# Patient Record
Sex: Female | Born: 2000 | Race: White | Hispanic: No | Marital: Single | Smoking: Never smoker
Health system: Southern US, Community
[De-identification: ages and names within clinical notes are randomized; demographics above are authoritative.]

## PROBLEM LIST (undated history)

## (undated) DIAGNOSIS — F32A Depression, unspecified: Secondary | ICD-10-CM

## (undated) DIAGNOSIS — F419 Anxiety disorder, unspecified: Secondary | ICD-10-CM

## (undated) HISTORY — DX: Anxiety disorder, unspecified: F41.9

## (undated) HISTORY — DX: Depression, unspecified: F32.A

---
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
## 2020-01-09 ENCOUNTER — Other Ambulatory Visit: Payer: Self-pay

## 2020-01-09 ENCOUNTER — Encounter: Payer: Self-pay | Admitting: Family Medicine

## 2020-01-09 ENCOUNTER — Ambulatory Visit: Payer: BC Managed Care – PPO | Admitting: Family Medicine

## 2020-01-09 VITALS — BP 123/80 | HR 91 | Temp 98.2°F | Ht 65.5 in | Wt 146.0 lb

## 2020-01-09 DIAGNOSIS — F339 Major depressive disorder, recurrent, unspecified: Secondary | ICD-10-CM

## 2020-01-09 DIAGNOSIS — Z3009 Encounter for other general counseling and advice on contraception: Secondary | ICD-10-CM | POA: Diagnosis not present

## 2020-01-09 LAB — PREGNANCY, URINE: Preg Test, Ur: NEGATIVE

## 2020-01-09 MED ORDER — SERTRALINE HCL 50 MG PO TABS: 50.0000 mg | ORAL_TABLET | Freq: Every day | ORAL | 1 refills | Status: DC

## 2020-01-09 NOTE — Progress Notes (Signed)
BP 123/80   Pulse 91   Temp 98.2 F (36.8 C)   Ht 5' 5.5" (1.664 m)   Wt 146 lb (66.2 kg)   LMP 01/04/2020   SpO2 99%   BMI 23.93 kg/m    Subjective:   Patient ID: Joy Green, female    DOB: April 18, 2001, 19 y.o.   MRN: [id number]  HPI: Joy Green is a 19 y.o. female presenting on 01/09/2020 for Medical Management of Chronic Issues, Contraception Management, and Depression   HPI Depression Patient is coming in today to discuss depression and anxiety. She says she has been anxious and depressed since she was 13 or 14 but it has not built up until this past year it has really built up to the point where she is just not feeling very well. She has started seeing a counselor just recently but he recommended that she come see [place] and discuss antidepressants. She has some passing suicidal ideations frequently but no action plan but she does admit that she used to cut when she was younger and she did cut herself once on her leg a week ago, no intentions to kill herself with that but just because of the feeling that it gives. She has not done it since. She has seen her counselor once over the phone and has another scheduled appointment. Patient denies any action plan for suicide currently and promises she will either call the hotline or [place] if she does get those thoughts again Depression screen [facility] 2/9 01/09/2020 11/29/2018 08/02/2017 12/11/2016 03/29/2016  Decreased Interest 3 0 - 0 0  Down, Depressed, Hopeless 3 0 0 0 0  PHQ - 2 Score 6 0 0 0 0  Altered sleeping 2 - 0 - -  Tired, decreased energy 1 - - - -  Change in appetite 1 - - - -  Feeling bad or failure about yourself  3 - - - -  Trouble concentrating 3 - - - -  Moving slowly or fidgety/restless 0 - - - -  Suicidal thoughts 1 - - - -  PHQ-9 Score 17 - 0 - -  Difficult doing work/chores - - - - -    Birth control counseling, continuation Patient is coming in for birth control counseling, she has been on Ortho-Cyclen but she has not been  the best about taking them consistently. She does have a new boyfriend that she is been with for 3 months and is sexually active but she is using condoms every time. She has done a lot of research and we discussed a lot of the forms of birth control and she would like to try Nexplanon.  Relevant past medical, surgical, family and social history reviewed and updated as indicated. Interim medical history since our last visit reviewed. Allergies and medications reviewed and updated.  Review of Systems  Constitutional: Negative for chills and fever.  Eyes: Negative for visual disturbance.  Respiratory: Negative for chest tightness and shortness of breath.   Cardiovascular: Negative for chest pain and leg swelling.  Gastrointestinal: Negative for abdominal pain.  Genitourinary: Negative for vaginal bleeding, vaginal discharge and vaginal pain.  Musculoskeletal: Negative for back pain and gait problem.  Skin: Negative for rash.  Neurological: Negative for light-headedness and headaches.  Psychiatric/Behavioral: Positive for dysphoric mood and sleep disturbance. Negative for agitation, behavioral problems, self-injury and suicidal ideas. The patient is nervous/anxious.   All other systems reviewed and are negative.   Per HPI unless specifically indicated above  Allergies as of 01/09/2020   No Known Allergies     Medication List       Accurate as of January 09, 2020 11:59 PM. If you have any questions, ask your nurse or doctor.        ibuprofen 200 MG tablet Commonly known as: ADVIL Take 400 mg by mouth as needed.   norgestimate-ethinyl estradiol 0.25-35 MG-MCG tablet Commonly known as: Sprintec 28 Take 1 tablet by mouth daily.   sertraline 50 MG tablet Commonly known as: Zoloft Take 1 tablet (50 mg total) by mouth daily. Started by: Elige Radon Kenetha Cozza, MD        Objective:   BP 123/80   Pulse 91   Temp 98.2 F (36.8 C)   Ht 5' 5.5" (1.664 m)   Wt 146 lb (66.2 kg)   LMP  01/04/2020   SpO2 99%   BMI 23.93 kg/m   Wt Readings from Last 3 Encounters:  01/09/20 146 lb (66.2 kg) (78 %, Z= 0.76)*  11/29/18 170 lb (77.1 kg) (93 %, Z= 1.48)*  08/02/17 147 lb 4 oz (66.8 kg) (84 %, Z= 1.01)*   * Growth percentiles are based on CDC (Girls, 2-20 Years) data.    Physical Exam Vitals and nursing note reviewed.  Constitutional:      General: She is not in acute distress.    Appearance: She is well-developed. She is not diaphoretic.  Eyes:     Conjunctiva/sclera: Conjunctivae normal.  Cardiovascular:     Rate and Rhythm: Normal rate and regular rhythm.     Heart sounds: Normal heart sounds. No murmur heard.   Pulmonary:     Effort: Pulmonary effort is normal. No respiratory distress.     Breath sounds: Normal breath sounds. No wheezing.  Musculoskeletal:        General: No tenderness. Normal range of motion.  Skin:    General: Skin is warm and dry.     Findings: No rash.  Neurological:     Mental Status: She is alert and oriented to person, place, and time.     Coordination: Coordination normal.  Psychiatric:        Mood and Affect: Mood is anxious and depressed.        Behavior: Behavior normal.        Thought Content: Thought content includes suicidal ideation. Thought content does not include suicidal plan.       Assessment & Plan:   Problem List Items Addressed This Visit    None    Visit Diagnoses    Birth control counseling    -  Primary   Relevant Orders   CBC with Differential/Platelet (Completed)   TSH (Completed)   Pregnancy, urine (Completed)   Depression, recurrent (HCC)       Relevant Medications   sertraline (ZOLOFT) 50 MG tablet      Will start patient on Zoloft, she does want to come back in for Nexplanon and she will do that at the next scheduled. She will continue her oral birth control until she gets the Nexplanon, she has enough she says. Follow up plan: Return in about 3 weeks (around 01/30/2020), or if symptoms worsen  or fail to improve, for Depression recheck and Nexplanon insertion.  Counseling provided for all of the vaccine components Orders Placed This Encounter  Procedures  . CBC with Differential/Platelet  . TSH  . Pregnancy, urine    Arville Care, MD [facility] 01/18/2020, 10:20 PM

## 2020-01-10 LAB — CBC WITH DIFFERENTIAL/PLATELET
Basophils Absolute: 0 10*3/uL (ref 0.0–0.2)
Basos: 0 %
EOS (ABSOLUTE): 0.2 10*3/uL (ref 0.0–0.4)
Eos: 2 %
Hematocrit: 40.6 % (ref 34.0–46.6)
Hemoglobin: 13.4 g/dL (ref 11.1–15.9)
Immature Grans (Abs): 0 10*3/uL (ref 0.0–0.1)
Immature Granulocytes: 0 %
Lymphocytes Absolute: 2.7 10*3/uL (ref 0.7–3.1)
Lymphs: 36 %
MCH: 29.7 pg (ref 26.6–33.0)
MCHC: 33 g/dL (ref 31.5–35.7)
MCV: 90 fL (ref 79–97)
Monocytes Absolute: 0.5 10*3/uL (ref 0.1–0.9)
Monocytes: 7 %
Neutrophils Absolute: 4.2 10*3/uL (ref 1.4–7.0)
Neutrophils: 55 %
Platelets: 297 10*3/uL (ref 150–450)
RBC: 4.51 x10E6/uL (ref 3.77–5.28)
RDW: 12.4 % (ref 11.7–15.4)
WBC: 7.7 10*3/uL (ref 3.4–10.8)

## 2020-01-10 LAB — TSH: TSH: 4.78 u[IU]/mL — ABNORMAL HIGH (ref 0.450–4.500)

## 2020-01-14 ENCOUNTER — Telehealth: Payer: Self-pay | Admitting: Family Medicine

## 2020-01-14 NOTE — Telephone Encounter (Signed)
Pt would like to be called with her lab results once reviewed by Dr Dettinger.

## 2020-01-28 ENCOUNTER — Encounter: Payer: Self-pay | Admitting: *Deleted

## 2020-01-28 ENCOUNTER — Encounter: Payer: Self-pay | Admitting: Family Medicine

## 2020-01-28 ENCOUNTER — Ambulatory Visit: Payer: BC Managed Care – PPO | Admitting: Family Medicine

## 2020-01-28 ENCOUNTER — Other Ambulatory Visit: Payer: Self-pay

## 2020-01-28 VITALS — BP 114/75 | HR 76 | Temp 98.2°F | Ht 65.5 in | Wt 144.0 lb

## 2020-01-28 DIAGNOSIS — Z30017 Encounter for initial prescription of implantable subdermal contraceptive: Secondary | ICD-10-CM

## 2020-01-28 DIAGNOSIS — R7989 Other specified abnormal findings of blood chemistry: Secondary | ICD-10-CM

## 2020-01-28 LAB — PREGNANCY, URINE: Preg Test, Ur: NEGATIVE

## 2020-01-28 MED ORDER — ETONOGESTREL 68 MG ~~LOC~~ IMPL: 68.0000 mg | DRUG_IMPLANT | Freq: Once | SUBCUTANEOUS | Status: DC

## 2020-01-28 NOTE — Progress Notes (Signed)
Nexplanon insertion: Patient educated on Nexplanon birth control and its side effects and the side effects from the procedure. Patient wishes to continue. Left inner arm prepped with Betadine. 3 mL of 2% lidocaine without epinephrine used for anesthesia. Device was prepped using sterile procedure. Nexplanon was inserted using package and Company directions. Steri-Strips were applied. Bleeding was minimal and patient tolerated procedure well.   Problem List Items Addressed This Visit    None    Visit Diagnoses    Encounter for initial prescription of implantable subdermal contraceptive    -  Primary   Relevant Medications   etonogestrel (NEXPLANON) implant 68 mg   Other Relevant Orders   Pregnancy, urine      Arville Care, MD [facility] 01/28/2020, 4:24 PM

## 2020-01-31 ENCOUNTER — Other Ambulatory Visit: Payer: Self-pay | Admitting: Family Medicine

## 2020-01-31 DIAGNOSIS — F339 Major depressive disorder, recurrent, unspecified: Secondary | ICD-10-CM

## 2020-02-24 ENCOUNTER — Other Ambulatory Visit: Payer: Self-pay

## 2020-02-24 ENCOUNTER — Ambulatory Visit: Payer: BC Managed Care – PPO | Admitting: Family Medicine

## 2020-02-24 ENCOUNTER — Encounter: Payer: Self-pay | Admitting: Family Medicine

## 2020-02-24 VITALS — BP 107/66 | HR 72 | Temp 97.9°F | Ht 65.5 in | Wt 146.0 lb

## 2020-02-24 DIAGNOSIS — R7989 Other specified abnormal findings of blood chemistry: Secondary | ICD-10-CM | POA: Diagnosis not present

## 2020-02-24 DIAGNOSIS — F339 Major depressive disorder, recurrent, unspecified: Secondary | ICD-10-CM | POA: Diagnosis not present

## 2020-02-24 DIAGNOSIS — F419 Anxiety disorder, unspecified: Secondary | ICD-10-CM

## 2020-02-24 MED ORDER — SERTRALINE HCL 50 MG PO TABS: 50.0000 mg | ORAL_TABLET | Freq: Every day | ORAL | 1 refills | Status: DC

## 2020-02-24 NOTE — Progress Notes (Signed)
BP 107/66   Pulse 72   Temp 97.9 F (36.6 C)   Ht 5' 5.5" (1.664 m)   Wt 146 lb (66.2 kg)   SpO2 98%   BMI 23.93 kg/m    Subjective:   Patient ID: Joy Green, female    DOB: 11/25/2000, 19 y.o.   MRN: [id number]  HPI: Joy Green is a 19 y.o. female presenting on 02/24/2020 for Medical Management of Chronic Issues and Depression   HPI Depression recheck Patient has been taking sertraline but she says she is been forgetting at times.  She still describes some mood lability where she goes to extreme irritability and anger sometimes down.  She has not been taking her medicine consistently and is getting more consistent, she had a job change 2 weeks ago which entailed a schedule change which is affected her ability to do more consistently.  Patient denies any suicidal ideations or thoughts of hurting self. Depression screen [facility] 2/9 02/24/2020 01/28/2020 01/09/2020 11/29/2018 08/02/2017  Decreased Interest 2 3 3  0 -  Down, Depressed, Hopeless 2 3 3  0 0  PHQ - 2 Score 4 6 6  0 0  Altered sleeping 1 2 2  - 0  Tired, decreased energy 1 1 1  - -  Change in appetite 1 1 1  - -  Feeling bad or failure about yourself  3 3 3  - -  Trouble concentrating 3 3 3  - -  Moving slowly or fidgety/restless 0 0 0 - -  Suicidal thoughts 1 1 1  - -  PHQ-9 Score 14 17 17  - 0  Difficult doing work/chores - - - - -    Patient had an elevated TSH on previous labs, will recheck that today.  Patient had a Nexplanon we will follow up on that, appears to be doing well.  Relevant past medical, surgical, family and social history reviewed and updated as indicated. Interim medical history since our last visit reviewed. Allergies and medications reviewed and updated.  Review of Systems  Constitutional: Negative for chills and fever.  Eyes: Negative for visual disturbance.  Respiratory: Negative for chest tightness and shortness of breath.   Cardiovascular: Negative for chest pain and leg swelling.    Musculoskeletal: Negative for back pain and gait problem.  Skin: Negative for rash.  Neurological: Negative for light-headedness and headaches.  Psychiatric/Behavioral: Positive for dysphoric mood and sleep disturbance. Negative for agitation, behavioral problems, self-injury and suicidal ideas. The patient is nervous/anxious.   All other systems reviewed and are negative.   Per HPI unless specifically indicated above   Allergies as of 02/24/2020   No Known Allergies     Medication List       Accurate as of February 24, 2020  4:37 PM. If you have any questions, ask your nurse or doctor.        STOP taking these medications   norgestimate-ethinyl estradiol 0.25-35 MG-MCG tablet Commonly known as: Sprintec 28 Stopped by: Delman Goshorn, MD     TAKE these medications   ibuprofen 200 MG tablet Commonly known as: ADVIL Take 400 mg by mouth as needed.   sertraline 50 MG tablet Commonly known as: ZOLOFT Take 1 tablet (50 mg total) by mouth at bedtime. What changed: when to take this Changed by: Eva Vallee, MD        Objective:   BP 107/66   Pulse 72   Temp 97.9 F (36.6 C)   Ht 5' 5.5" (1.664 m)    146 lb (66.2 kg)   SpO2 98%   BMI 23.93 kg/m   Wt Readings from Last 3 Encounters:  02/24/20 146 lb (66.2 kg) (77 %, Z= 0.75)*  01/28/20 144 lb (65.3 kg) (75 %, Z= 0.69)*  01/09/20 146 lb (66.2 kg) (78 %, Z= 0.76)*   * Growth percentiles are based on CDC (Girls, 2-20 Years) data.    Physical Exam Vitals and nursing note reviewed.  Constitutional:      General: She is not in acute distress.    Appearance: She is well-developed. She is not diaphoretic.  Eyes:     Conjunctiva/sclera: Conjunctivae normal.  Cardiovascular:     Rate and Rhythm: Normal rate and regular rhythm.     Heart sounds: Normal heart sounds. No murmur heard.   Pulmonary:     Effort: Pulmonary effort is normal. No respiratory distress.     Breath sounds: Normal breath sounds. No  wheezing.  Musculoskeletal:        General: No tenderness. Normal range of motion.  Skin:    General: Skin is warm and dry.     Findings: No rash.  Neurological:     Mental Status: She is alert and oriented to person, place, and time.     Coordination: Coordination normal.  Psychiatric:        Mood and Affect: Mood is anxious and depressed.        Behavior: Behavior normal.        Thought Content: Thought content does not include suicidal ideation. Thought content does not include suicidal plan.       Assessment & Plan:   Problem List Items Addressed This Visit      Other   Elevated TSH   Relevant Orders   Thyroid Panel With TSH    Other Visit Diagnoses    Depression, recurrent (HCC)    -  Primary   Relevant Medications   sertraline (ZOLOFT) 50 MG tablet   Anxiety       Relevant Medications   sertraline (ZOLOFT) 50 MG tablet      Will continue the Zoloft at the current dose and follow-up in a couple months.  We will recheck thyroid levels because she was borderline on the last time. Follow up plan: Return in about 2 months (around 04/25/2020), or if symptoms worsen or fail to improve, for Depression and anxiety recheck.  Counseling provided for all of the vaccine components Orders Placed This Encounter  Procedures  . Thyroid Panel With TSH    Arville Care, MD [facility] 02/24/2020, 4:37 PM

## 2020-02-25 LAB — THYROID PANEL WITH TSH
Free Thyroxine Index: 1.5 (ref 1.2–4.9)
T3 Uptake Ratio: 24 % (ref 24–39)
T4, Total: 6.2 ug/dL (ref 4.5–12.0)
TSH: 1.5 u[IU]/mL (ref 0.450–4.500)

## 2020-02-26 ENCOUNTER — Other Ambulatory Visit: Payer: Self-pay

## 2020-02-26 ENCOUNTER — Encounter: Payer: Self-pay | Admitting: Family

## 2020-02-26 ENCOUNTER — Ambulatory Visit (INDEPENDENT_AMBULATORY_CARE_PROVIDER_SITE_OTHER): Payer: BC Managed Care – PPO | Admitting: Family

## 2020-02-26 ENCOUNTER — Other Ambulatory Visit (HOSPITAL_COMMUNITY)
Admission: RE | Admit: 2020-02-26 | Discharge: 2020-02-26 | Disposition: A | Discharge status: Home / Self Care | Payer: BC Managed Care – PPO | Source: Ambulatory Visit | Attending: Family | Admitting: Family

## 2020-02-26 VITALS — BP 127/76 | HR 98 | Temp 97.8°F | Ht 65.5 in | Wt 149.3 lb

## 2020-02-26 DIAGNOSIS — Z0000 Encounter for general adult medical examination without abnormal findings: Secondary | ICD-10-CM | POA: Diagnosis not present

## 2020-02-26 DIAGNOSIS — Z01419 Encounter for gynecological examination (general) (routine) without abnormal findings: Secondary | ICD-10-CM | POA: Diagnosis not present

## 2020-02-26 DIAGNOSIS — Z1151 Encounter for screening for human papillomavirus (HPV): Secondary | ICD-10-CM | POA: Diagnosis not present

## 2020-02-26 NOTE — Progress Notes (Signed)
Subjective:    Patient ID: Joy Green, female    DOB: [birth year], 19 y.o.   MRN: [id number]  Chief Complaint  Patient presents with  . CPE    with Pap   PT presents to the office today for CPE with pap. Denies vaginal discharge or pain. She is sexually active since she was 19 years old. She reports she has had approx 7-8 partners.   She has Nexplanon place in July 2021.  Gynecologic Exam The patient's pertinent negatives include no genital itching, genital lesions, genital odor, vaginal bleeding or vaginal discharge. Pertinent negatives include no constipation, diarrhea, painful intercourse, sore throat or urgency. The treatment provided no relief.      Review of Systems  HENT: Negative for sore throat.   Gastrointestinal: Negative for constipation and diarrhea.  Genitourinary: Negative for urgency and vaginal discharge.  All other systems reviewed and are negative.  Family History  Problem Relation Age of Onset  . Hypertension Father   . Cancer Maternal Grandmother        breast  . Diabetes Paternal Grandmother   . COPD Paternal Grandmother   . Cancer Paternal Grandfather lung   Social History   Socioeconomic History  . Marital status: Single    Spouse name: Not on file  . Number of children: Not on file  . Years of education: Not on file  . Highest education level: Not on file  Occupational History  . Not on file  Tobacco Use  . Smoking status: Never Smoker  . Smokeless tobacco: Never Used  Substance and Sexual Activity  . Alcohol use: Not on file  . Drug use: Not on file  . Sexual activity: Not on file  Other Topics Concern  . Not on file  Social History Narrative  . Not on file   Social Determinants of Health   Financial Resource Strain:   . Difficulty of Paying Living Expenses: Not on file  Food Insecurity:   . Worried About Programme researcher, broadcasting/film/video in the Last Year: Not on file  . Ran Out of Food in the Last Year: Not on file  Transportation Needs:    . Lack of Transportation (Medical): Not on file  . Lack of Transportation (Non-Medical): Not on file  Physical Activity:   . Days of Exercise per Week: Not on file  . Minutes of Exercise per Session: Not on file  Stress:   . Feeling of Stress : Not on file  Social Connections:   . Frequency of Communication with Friends and Family: Not on file  . Frequency of Social Gatherings with Friends and Family: Not on file  . Attends Religious Services: Not on file  . Active Member of Clubs or Organizations: Not on file  . Attends Banker Meetings: Not on file  . Marital Status: Not on file  Intimate Partner Violence:   . Fear of Current or Ex-Partner: Not on file  . Emotionally Abused: Not on file  . Physically Abused: Not on file  . Sexually Abused: Not on file       Objective:   Physical Exam Vitals reviewed.  Constitutional:      General: She is not in acute distress.    Appearance: She is well-developed.  HENT:     Head: Normocephalic and atraumatic.     Right Ear: Tympanic membrane normal.     Left Ear: Tympanic membrane normal.  Eyes:     Pupils: Pupils are equal, round,  and reactive to light.  Neck:     Thyroid: No thyromegaly.  Cardiovascular:     Rate and Rhythm: Normal rate and regular rhythm.     Heart sounds: Normal heart sounds. No murmur heard.   Pulmonary:     Effort: Pulmonary effort is normal. No respiratory distress.     Breath sounds: Normal breath sounds. No wheezing.  Chest:     Breasts:        Right: No swelling, bleeding, inverted nipple, mass, nipple discharge, skin change or tenderness.        Left: No swelling, bleeding, inverted nipple, mass, nipple discharge, skin change or tenderness.  Abdominal:     General: Bowel sounds are normal. There is no distension.     Palpations: Abdomen is soft.     Tenderness: There is no abdominal tenderness.  Genitourinary:    General: Normal vulva.     Comments: Bimanual exam- no adnexal masses or  tenderness, ovaries nonpalpable   Cervix parous and pink- No discharge  Musculoskeletal:        General: No tenderness. Normal range of motion.     Cervical back: Normal range of motion and neck supple.  Skin:    General: Skin is warm and dry.  Neurological:     Mental Status: She is alert and oriented to person, place, and time.     Cranial Nerves: No cranial nerve deficit.     Deep Tendon Reflexes: Reflexes are normal and symmetric.  Psychiatric:        Behavior: Behavior normal.        Thought Content: Thought content normal.        Judgment: Judgment normal.       BP 127/76   Pulse 98   Temp 97.8 F (36.6 C)   Ht 5' 5.5" (1.664 m)   Wt 149 lb 5 oz (67.7 kg)   BMI 24.47 kg/m      Assessment & Plan:  GLYNNIS GAVEL comes in today with chief complaint of CPE (with Pap)   Diagnosis and orders addressed:  1. Annual physical exam  2. Gynecologic exam normal - Cytology - PAP([place])  Labs drawn last month Health Maintenance reviewed Diet and exercise encouraged  Follow up plan: As needed and keep follow up with PCP  Jannifer Rodney, FNP

## 2020-02-26 NOTE — Patient Instructions (Signed)

## 2020-03-10 LAB — CYTOLOGY - PAP
Chlamydia: NEGATIVE
Comment: NEGATIVE
Comment: NEGATIVE
Comment: NEGATIVE
Comment: NEGATIVE
Comment: NORMAL
HSV1: NEGATIVE
HSV2: NEGATIVE
High risk HPV: POSITIVE — AB
Neisseria Gonorrhea: NEGATIVE
Trichomonas: NEGATIVE

## 2020-03-31 ENCOUNTER — Encounter: Payer: Self-pay | Admitting: *Deleted

## 2020-05-05 ENCOUNTER — Ambulatory Visit: Payer: BC Managed Care – PPO | Admitting: Family Medicine

## 2020-05-05 ENCOUNTER — Other Ambulatory Visit: Payer: Self-pay

## 2020-05-05 ENCOUNTER — Encounter: Payer: Self-pay | Admitting: Family Medicine

## 2020-05-05 VITALS — BP 107/69 | HR 110 | Temp 98.0°F | Ht 65.0 in | Wt 161.1 lb

## 2020-05-05 DIAGNOSIS — Z7251 High risk heterosexual behavior: Secondary | ICD-10-CM

## 2020-05-05 DIAGNOSIS — R7989 Other specified abnormal findings of blood chemistry: Secondary | ICD-10-CM

## 2020-05-05 MED ORDER — SERTRALINE HCL 100 MG PO TABS: 100.0000 mg | ORAL_TABLET | Freq: Every day | ORAL | 3 refills | Status: DC

## 2020-05-05 NOTE — Addendum Note (Signed)
Addended by: Arville Care on: 05/05/2020 03:42 PM   Modules accepted: Orders

## 2020-05-05 NOTE — Progress Notes (Signed)
BP 107/69    Pulse (!) 110    Temp 98 F (36.7 C)    Ht 5\' 5"  (1.651 m)    Wt 161 lb 2 oz (73.1 kg)    SpO2 100%    BMI 26.81 kg/m    Subjective:   Patient ID: , female    DOB: 2000-09-21, 19 y.o.   MRN: [id number]  HPI: Joy Green is a 19 y.o. female presenting on 05/05/2020 for Medical Management of Chronic Issues and Depression   HPI Patient is coming in today for depression anxiety recheck She says she still has the occasional passing suicidal thoughts but nothing concrete.  She still is feeling down and depressed although she is improved.  She does feel like the Zoloft is helping her but does not completely where she would want to be.  She still has some decreased motivation.  She says her appetite and sleep schedule is doing very well. Depression screen [facility] 2/9 05/05/2020 02/26/2020 02/24/2020 01/28/2020 01/09/2020  Decreased Interest 2 2 2 3 3   Down, Depressed, Hopeless 1 2 2 3 3   PHQ - 2 Score 3 4 4 6 6   Altered sleeping 0 1 1 2 2   Tired, decreased energy 2 1 1 1 1   Change in appetite 1 1 1 1 1   Feeling bad or failure about yourself  2 3 3 3 3   Trouble concentrating 2 3 3 3 3   Moving slowly or fidgety/restless 0 0 0 0 0  Suicidal thoughts 1 1 1 1 1   PHQ-9 Score 11 14 14 17 17   Difficult doing work/chores - - - - -     Patient is sexually active and has had 2 partners in the past couple years and would like to do STD testing today.  She had a Pap smear with STD screening and it was normal for the STD screening on the vaginal swab  Relevant past medical, surgical, family and social history reviewed and updated as indicated. Interim medical history since our last visit reviewed. Allergies and medications reviewed and updated.  Review of Systems  Constitutional: Negative for chills and fever.  Eyes: Negative for visual disturbance.  Respiratory: Negative for chest tightness and shortness of breath.   Cardiovascular: Negative for chest pain and leg swelling.    Musculoskeletal: Negative for back pain and gait problem.  Skin: Negative for rash.  Neurological: Negative for dizziness, light-headedness and headaches.  Psychiatric/Behavioral: Positive for dysphoric mood. Negative for agitation, behavioral problems, self-injury, sleep disturbance and suicidal ideas. The patient is nervous/anxious.   All other systems reviewed and are negative.   Per HPI unless specifically indicated above   Allergies as of 05/05/2020   No Known Allergies     Medication List       Accurate as of May 05, 2020  3:08 PM. If you have any questions, ask your nurse or doctor.        ibuprofen 200 MG tablet Commonly known as: ADVIL Take 400 mg by mouth as needed.   sertraline 50 MG tablet Commonly known as: ZOLOFT Take 1 tablet (50 mg total) by mouth at bedtime.        Objective:   BP 107/69    Pulse (!) 110    Temp 98 F (36.7 C)    Ht 5\' 5"  (1.651 m)    Wt 161 lb 2 oz (73.1 kg)    SpO2 100%    BMI 26.81 kg/m   Wt  Readings from Last 3 Encounters:  05/05/20 161 lb 2 oz (73.1 kg) (88 %, Z= 1.18)*  02/26/20 149 lb 5 oz (67.7 kg) (80 %, Z= 0.85)*  02/24/20 146 lb (66.2 kg) (77 %, Z= 0.75)*   * Growth percentiles are based on CDC (Girls, 2-20 Years) data.    Physical Exam Vitals and nursing note reviewed.  Constitutional:      General: She is not in acute distress.    Appearance: She is well-developed. She is not diaphoretic.  Eyes:     Conjunctiva/sclera: Conjunctivae normal.  Cardiovascular:     Rate and Rhythm: Normal rate and regular rhythm.     Heart sounds: Normal heart sounds. No murmur heard.   Pulmonary:     Effort: Pulmonary effort is normal. No respiratory distress.     Breath sounds: Normal breath sounds. No wheezing.  Musculoskeletal:        General: No tenderness. Normal range of motion.  Skin:    General: Skin is warm and dry.     Findings: No rash.  Neurological:     Mental Status: She is alert and oriented to person,  place, and time.     Coordination: Coordination normal.  Psychiatric:        Behavior: Behavior normal.       Assessment & Plan:   Problem List Items Addressed This Visit      Other   Elevated TSH - Primary   Relevant Orders   Thyroid Panel With TSH    Other Visit Diagnoses    Sexually active at young age       Relevant Orders   STD Screen (8)      She has some abnormal cycles, will give cycle or 2 before changes. Follow up plan: Return in about 4 weeks (around 06/02/2020), or if symptoms worsen or fail to improve, for Depression and anxiety recheck.  Counseling provided for all of the vaccine components No orders of the defined types were placed in this encounter.   Arville Care, MD [facility] 05/05/2020, 3:08 PM

## 2020-05-06 DIAGNOSIS — Z23 Encounter for immunization: Secondary | ICD-10-CM | POA: Diagnosis not present

## 2020-05-06 LAB — THYROID PANEL WITH TSH
Free Thyroxine Index: 1.3 (ref 1.2–4.9)
T3 Uptake Ratio: 24 % (ref 24–39)
T4, Total: 5.5 ug/dL (ref 4.5–12.0)
TSH: 2 u[IU]/mL (ref 0.450–4.500)

## 2020-05-06 LAB — STD SCREEN (8)
HIV Screen 4th Generation wRfx: NONREACTIVE
HSV 1 Glycoprotein G Ab, IgG: <0.91 index (ref 0.00–0.90)
HSV 2 IgG, Type Spec: <0.91 index (ref 0.00–0.90)
Hep A IgM: NEGATIVE
Hep B C IgM: NEGATIVE
Hep C Virus Ab: <0.1 s/co ratio (ref 0.0–0.9)
Hepatitis B Surface Ag: NEGATIVE
RPR Ser Ql: NONREACTIVE

## 2020-05-17 ENCOUNTER — Telehealth: Payer: Self-pay | Admitting: Family Medicine

## 2020-05-17 ENCOUNTER — Other Ambulatory Visit: Payer: Self-pay | Admitting: Family Medicine

## 2020-05-17 DIAGNOSIS — Z3009 Encounter for other general counseling and advice on contraception: Secondary | ICD-10-CM

## 2020-05-17 MED ORDER — NORGESTIMATE-ETH ESTRADIOL 0.25-35 MG-MCG PO TABS: 1.0000 | ORAL_TABLET | Freq: Every day | ORAL | 0 refills | Status: DC

## 2020-05-17 NOTE — Telephone Encounter (Signed)
First month no cycle  2nd month spotted for two weeks  This months period started off with spotting but is now heavy.  Try one month of OCP? Dr. Louanne Skye mentioned trying this.  Pt does feel tired.   Depression medication is helping with the anxiety and fatigue.    [facility]

## 2020-05-17 NOTE — Telephone Encounter (Signed)
I sent 1 month of birth control pill that she can try to suppress the periods.

## 2020-05-18 NOTE — Telephone Encounter (Signed)
Patient aware.

## 2020-05-27 ENCOUNTER — Other Ambulatory Visit: Payer: Self-pay | Admitting: Family Medicine

## 2020-06-07 ENCOUNTER — Other Ambulatory Visit: Payer: Self-pay | Admitting: Family Medicine

## 2020-06-08 DIAGNOSIS — Z23 Encounter for immunization: Secondary | ICD-10-CM | POA: Diagnosis not present

## 2020-06-15 ENCOUNTER — Encounter: Payer: Self-pay | Admitting: Family Medicine

## 2020-06-15 ENCOUNTER — Telehealth (INDEPENDENT_AMBULATORY_CARE_PROVIDER_SITE_OTHER): Payer: BC Managed Care – PPO | Admitting: Family Medicine

## 2020-06-15 DIAGNOSIS — F339 Major depressive disorder, recurrent, unspecified: Secondary | ICD-10-CM | POA: Diagnosis not present

## 2020-06-15 DIAGNOSIS — J029 Acute pharyngitis, unspecified: Secondary | ICD-10-CM

## 2020-06-15 LAB — RAPID STREP SCREEN (MED CTR MEBANE ONLY): Strep Gp A Ag, IA W/Reflex: NEGATIVE

## 2020-06-15 LAB — CULTURE, GROUP A STREP

## 2020-06-15 NOTE — Addendum Note (Signed)
Addended by: Margorie John on: 06/15/2020 04:38 PM   Modules accepted: Orders

## 2020-06-15 NOTE — Progress Notes (Signed)
Virtual Visit via mychart video Note  I connected with Joy Green on 06/15/20 at 1545 by video and verified that I am speaking with the correct person using two identifiers. Joy Green is currently located at home and patient are currently with her during visit. The provider, Elige Radon Jaques Mineer, MD is located in their office at time of visit.  Call ended at 1556  I discussed the limitations, risks, security and privacy concerns of performing an evaluation and management service by video and the availability of in person appointments. I also discussed with the patient that there may be a patient responsible charge related to this service. The patient expressed understanding and agreed to proceed.   History and Present Illness: Patient is having a sore throat and is swollen and sore to touch.  She denies any fevers or chills and this started 3 days ago. She denies any significant cough or shortness of breath.  She took some advil which did help  She is taking zoloft 100mg  and she feels 100 times better and is having some sleepiness. She denies suicidal ideations and is feeling good.  She is very content.   No diagnosis found.  Outpatient Encounter Medications as of 06/15/2020  Medication Sig  . ibuprofen (ADVIL,MOTRIN) 200 MG tablet Take 400 mg by mouth as needed.   . norgestimate-ethinyl estradiol (ORTHO-CYCLEN) 0.25-35 MG-MCG tablet TAKE 1 TABLET BY MOUTH EVERY DAY  . sertraline (ZOLOFT) 100 MG tablet TAKE 1 TABLET BY MOUTH EVERY DAY   Facility-Administered Encounter Medications as of 06/15/2020  Medication  . etonogestrel (NEXPLANON) implant 68 mg    Review of Systems  Constitutional: Negative for chills and fever.  HENT: Positive for congestion, postnasal drip, rhinorrhea, sinus pressure, sneezing and sore throat. Negative for ear discharge and ear pain.   Eyes: Negative for pain, redness and visual disturbance.  Respiratory: Positive for cough. Negative for chest tightness  and shortness of breath.   Cardiovascular: Negative for chest pain and leg swelling.  Musculoskeletal: Negative for back pain and gait problem.  Skin: Negative for rash.  Neurological: Negative for light-headedness and headaches.  Psychiatric/Behavioral: Negative for agitation and behavioral problems.  All other systems reviewed and are negative.   Observations/Objective: Patient sounds comfortable and in no acute distress  Assessment and Plan: Problem List Items Addressed This Visit    None    Visit Diagnoses    Pharyngitis, unspecified etiology    -  Primary   Relevant Orders   Novel Coronavirus, NAA ([facility])   Rapid Strep Screen ([facility] ONLY)   Depression, recurrent (HCC)          Patient sounds like she has possible strep, will do testing for strep but will also do Covid testing, quarantine until results come back. Depression seems to be doing well, minimal side effects. Follow up plan: Return in about 4 weeks (around 07/13/2020), or if symptoms worsen or fail to improve, for recheck in 1 month for depression.     I discussed the assessment and treatment plan with the patient. The patient was provided an opportunity to ask questions and all were answered. The patient agreed with the plan and demonstrated an understanding of the instructions.   The patient was advised to call back or seek an in-person evaluation if the symptoms worsen or if the condition fails to improve as anticipated.  The above assessment and management plan was discussed with the patient. The patient verbalized understanding of and has agreed  to the management plan. Patient is aware to call the clinic if symptoms persist or worsen. Patient is aware when to return to the clinic for a follow-up visit. Patient educated on when it is appropriate to go to the emergency department.    I provided 11 minutes of non-face-to-face time during this encounter.    Nils Pyle, MD

## 2020-06-16 LAB — NOVEL CORONAVIRUS, NAA: SARS-CoV-2, NAA: DETECTED — AB

## 2020-06-16 LAB — SARS-COV-2, NAA 2 DAY TAT

## 2020-06-23 ENCOUNTER — Telehealth: Payer: Self-pay

## 2020-06-29 ENCOUNTER — Other Ambulatory Visit: Payer: Self-pay | Admitting: Family Medicine

## 2020-07-14 ENCOUNTER — Encounter: Payer: Self-pay | Admitting: Family Medicine

## 2020-07-14 ENCOUNTER — Ambulatory Visit (INDEPENDENT_AMBULATORY_CARE_PROVIDER_SITE_OTHER): Payer: BC Managed Care – PPO | Admitting: Family Medicine

## 2020-07-14 ENCOUNTER — Other Ambulatory Visit: Payer: Self-pay

## 2020-07-14 VITALS — BP 112/71 | HR 89 | Ht 65.0 in | Wt 169.0 lb

## 2020-07-14 DIAGNOSIS — F419 Anxiety disorder, unspecified: Secondary | ICD-10-CM

## 2020-07-14 DIAGNOSIS — Z23 Encounter for immunization: Secondary | ICD-10-CM

## 2020-07-14 DIAGNOSIS — F339 Major depressive disorder, recurrent, unspecified: Secondary | ICD-10-CM | POA: Diagnosis not present

## 2020-07-14 MED ORDER — SERTRALINE HCL 100 MG PO TABS: 100.0000 mg | ORAL_TABLET | Freq: Every day | ORAL | 1 refills | Status: DC

## 2020-07-14 NOTE — Progress Notes (Signed)
BP 112/71   Pulse 89   Ht 5\' 5"  (1.651 m)   Wt 169 lb (76.7 kg)   SpO2 94%   BMI 28.12 kg/m    Subjective:   Patient ID: , female    DOB: 2000-11-30, 20 y.o.   MRN: [id number]  HPI: Joy Green is a 20 y.o. female presenting on 07/14/2020 for Medical Management of Chronic Issues, Depression, and Anxiety   HPI Depression and Anxiety Patient is coming in today with depression and anxiety recheck. She says she is doing very well with her medicines and is not feeling as tired and feels like her anxiety is under control. She is working in her job and feels like that is going well and she is going to start back in school and is very excited for that. She denies any suicidal ideations or thoughts of hurting herself. Depression screen [facility] 2/9 07/14/2020 05/05/2020 02/26/2020 02/24/2020 01/28/2020  Decreased Interest 1 2 2 2 3   Down, Depressed, Hopeless 1 1 2 2 3   PHQ - 2 Score 2 3 4 4 6   Altered sleeping - 0 1 1 2   Tired, decreased energy - 2 1 1 1   Change in appetite - 1 1 1 1   Feeling bad or failure about yourself  - 2 3 3 3   Trouble concentrating - 2 3 3 3   Moving slowly or fidgety/restless - 0 0 0 0  Suicidal thoughts - 1 1 1 1   PHQ-9 Score - 11 14 14 17   Difficult doing work/chores - - - - -     Relevant past medical, surgical, family and social history reviewed and updated as indicated. Interim medical history since our last visit reviewed. Allergies and medications reviewed and updated.  Review of Systems  Constitutional: Negative for chills and fever.  Eyes: Negative for visual disturbance.  Respiratory: Negative for chest tightness and shortness of breath.   Cardiovascular: Negative for chest pain and leg swelling.  Musculoskeletal: Negative for back pain and gait problem.  Skin: Negative for rash.  Neurological: Negative for light-headedness and headaches.  Psychiatric/Behavioral: Positive for dysphoric mood. Negative for agitation, behavioral problems,  self-injury, sleep disturbance and suicidal ideas. The patient is nervous/anxious.   All other systems reviewed and are negative.   Per HPI unless specifically indicated above   Allergies as of 07/14/2020   No Known Allergies     Medication List       Accurate as of July 14, 2020  3:34 PM. If you have any questions, ask your nurse or doctor.        STOP taking these medications   norgestimate-ethinyl estradiol 0.25-35 MG-MCG tablet Commonly known as: ORTHO-CYCLEN Stopped by: Krishav Mamone, MD     TAKE these medications   ibuprofen 200 MG tablet Commonly known as: ADVIL Take 400 mg by mouth as needed.   sertraline 100 MG tablet Commonly known as: ZOLOFT Take 1 tablet (100 mg total) by mouth daily.        Objective:   BP 112/71   Pulse 89   Ht 5\' 5"  (1.651 m)   Wt 169 lb (76.7 kg)   SpO2 94%   BMI 28.12 kg/m   Wt Readings from Last 3 Encounters:  07/14/20 169 lb (76.7 kg) (91 %, Z= 1.36)*  05/05/20 161 lb 2 oz (73.1 kg) (88 %, Z= 1.18)*  02/26/20 149 lb 5 oz (67.7 kg) (80 %, Z= 0.85)*   * Growth percentiles are based  on CDC (Girls, 2-20 Years) data.    Physical Exam Vitals and nursing note reviewed.  Constitutional:      General: She is not in acute distress.    Appearance: She is well-developed and well-nourished. She is not diaphoretic.  Eyes:     Extraocular Movements: EOM normal.  Cardiovascular:     Pulses: Intact distal pulses.  Musculoskeletal:        General: No edema.  Skin:    General: Skin is warm and dry.     Findings: No rash.  Neurological:     Mental Status: She is alert and oriented to person, place, and time.     Coordination: Coordination normal.  Psychiatric:        Mood and Affect: Mood and affect normal.        Behavior: Behavior normal.       Assessment & Plan:   Problem List Items Addressed This Visit   None   Visit Diagnoses    Depression, recurrent (HCC)    -  Primary   Relevant Medications   sertraline  (ZOLOFT) 100 MG tablet   Anxiety       Relevant Medications   sertraline (ZOLOFT) 100 MG tablet   Need for immunization against influenza       Relevant Orders   Flu Vaccine QUAD 36+ mos IM (Completed)      Continue with sertraline, patient seems to be doing well, she will schedule her physical later in the year and also come back in 6 months for recheck. Follow up plan: Return in about 6 months (around 01/11/2021), or if symptoms worsen or fail to improve, for depression and anxiety, .  Counseling provided for all of the vaccine components No orders of the defined types were placed in this encounter.   Arville Care, MD [facility] 07/14/2020, 3:34 PM

## 2020-07-14 NOTE — Patient Instructions (Signed)
[facility] [address] [facility]

## 2020-07-16 ENCOUNTER — Other Ambulatory Visit: Payer: Self-pay | Admitting: Family Medicine

## 2020-07-16 DIAGNOSIS — F339 Major depressive disorder, recurrent, unspecified: Secondary | ICD-10-CM

## 2020-07-16 DIAGNOSIS — F419 Anxiety disorder, unspecified: Secondary | ICD-10-CM

## 2020-07-29 ENCOUNTER — Other Ambulatory Visit: Payer: Self-pay | Admitting: Family Medicine

## 2021-01-07 ENCOUNTER — Encounter: Payer: Self-pay | Admitting: Family Medicine

## 2021-01-07 ENCOUNTER — Other Ambulatory Visit: Payer: Self-pay

## 2021-01-07 ENCOUNTER — Ambulatory Visit: Payer: BC Managed Care – PPO | Admitting: Family Medicine

## 2021-01-07 VITALS — BP 124/73 | HR 84 | Temp 98.4°F | Ht 65.0 in | Wt 202.2 lb

## 2021-01-07 DIAGNOSIS — Z3009 Encounter for other general counseling and advice on contraception: Secondary | ICD-10-CM

## 2021-01-07 DIAGNOSIS — F339 Major depressive disorder, recurrent, unspecified: Secondary | ICD-10-CM

## 2021-01-07 DIAGNOSIS — F419 Anxiety disorder, unspecified: Secondary | ICD-10-CM

## 2021-01-07 MED ORDER — SERTRALINE HCL 100 MG PO TABS: 100.0000 mg | ORAL_TABLET | Freq: Every day | ORAL | 1 refills | Status: DC

## 2021-01-07 NOTE — Progress Notes (Signed)
BP 124/73   Pulse 84   Temp 98.4 F (36.9 C) (Temporal)   Ht 5\' 5"  (1.651 m)   Wt 202 lb 3.2 oz (91.7 kg)   SpO2 98%   BMI 33.65 kg/m    Subjective:   Patient ID: , female    DOB: 26-Dec-2000, 20 y.o.   MRN: [id number]  HPI: Joy Green is a 20 y.o. female presenting on 01/07/2021 for Depression and Discuss birth control   HPI Depression recheck Patient is coming in today for depression recheck.  She feels like things are going well on the Zoloft and would not like to change and would like to keep going forward.  The only thing she has is that she has been gaining some weight on the Nexplanon and she would like to discuss changing not because that is starting to affect her mood as well.  Depression screen [facility] 2/9 01/07/2021 07/14/2020 05/05/2020 02/26/2020 02/24/2020  Decreased Interest 0 1 2 2 2   Down, Depressed, Hopeless 2 1 1 2 2   PHQ - 2 Score 2 2 3 4 4   Altered sleeping 0 - 0 1 1  Tired, decreased energy 1 - 2 1 1   Change in appetite 1 - 1 1 1   Feeling bad or failure about yourself  1 - 2 3 3   Trouble concentrating 2 - 2 3 3   Moving slowly or fidgety/restless 0 - 0 0 0  Suicidal thoughts 0 - 1 1 1   PHQ-9 Score 7 - 11 14 14   Difficult doing work/chores Somewhat difficult - - - -     Relevant past medical, surgical, family and social history reviewed and updated as indicated. Interim medical history since our last visit reviewed. Allergies and medications reviewed and updated.  Review of Systems  Constitutional:  Positive for unexpected weight change. Negative for chills and fever.  Eyes:  Negative for visual disturbance.  Respiratory:  Negative for chest tightness and shortness of breath.   Cardiovascular:  Negative for chest pain and leg swelling.  Musculoskeletal:  Negative for back pain and gait problem.  Skin:  Negative for rash.  Neurological:  Negative for light-headedness and headaches.  Psychiatric/Behavioral:  Negative for agitation, behavioral  problems, dysphoric mood and sleep disturbance. The patient is not nervous/anxious.   All other systems reviewed and are negative.  Per HPI unless specifically indicated above   Allergies as of 01/07/2021   No Known Allergies      Medication List        Accurate as of January 07, 2021 11:59 PM. If you have any questions, ask your nurse or doctor.          ibuprofen 200 MG tablet Commonly known as: ADVIL Take 400 mg by mouth as needed.   sertraline 100 MG tablet Commonly known as: ZOLOFT Take 1 tablet (100 mg total) by mouth daily.         Objective:   BP 124/73   Pulse 84   Temp 98.4 F (36.9 C) (Temporal)   Ht 5\' 5"  (1.651 m)   Wt 202 lb 3.2 oz (91.7 kg)   SpO2 98%   BMI 33.65 kg/m   Wt Readings from Last 3 Encounters:  01/07/21 202 lb 3.2 oz (91.7 kg)  07/14/20 169 lb (76.7 kg) (91 %, Z= 1.36)*  05/05/20 161 lb 2 oz (73.1 kg) (88 %, Z= 1.18)*   * Growth percentiles are based on CDC (Girls, 2-20 Years) data.  Physical Exam Vitals and nursing note reviewed.  Constitutional:      Appearance: Normal appearance.  Skin:    General: Skin is warm and dry.     Findings: No rash.  Neurological:     Mental Status: She is alert.  Psychiatric:        Mood and Affect: Mood normal.        Behavior: Behavior normal.        Thought Content: Thought content normal.        Judgment: Judgment normal.      Assessment & Plan:   Problem List Items Addressed This Visit   None Visit Diagnoses     Depression, recurrent (HCC)    -  Primary   Relevant Medications   sertraline (ZOLOFT) 100 MG tablet   Anxiety       Relevant Medications   sertraline (ZOLOFT) 100 MG tablet   Counseling for initiation of birth control method         Things are going well with the Zoloft.  She would like to continue forward with that.  She did want to discuss forms of birth control and would like to try changing from Nexplanon because of weight gain.  She is going to think about  those and get back to [place].  Follow up plan: Return in about 6 months (around 07/10/2021), or if symptoms worsen or fail to improve, for Anxiety depression recheck.  Counseling provided for all of the vaccine components No orders of the defined types were placed in this encounter.   Arville Care, MD [facility] 01/19/2021, 2:31 PM

## 2021-02-24 ENCOUNTER — Ambulatory Visit (INDEPENDENT_AMBULATORY_CARE_PROVIDER_SITE_OTHER): Payer: BC Managed Care – PPO | Admitting: Family

## 2021-02-24 ENCOUNTER — Other Ambulatory Visit (HOSPITAL_COMMUNITY)
Admission: RE | Admit: 2021-02-24 | Discharge: 2021-02-24 | Disposition: A | Discharge status: Home / Self Care | Payer: BC Managed Care – PPO | Source: Ambulatory Visit | Attending: Family | Admitting: Family

## 2021-02-24 ENCOUNTER — Other Ambulatory Visit: Payer: Self-pay

## 2021-02-24 ENCOUNTER — Encounter: Payer: Self-pay | Admitting: Family

## 2021-02-24 VITALS — BP 124/72 | HR 76 | Temp 98.3°F | Ht 65.0 in | Wt 199.6 lb

## 2021-02-24 DIAGNOSIS — Z0000 Encounter for general adult medical examination without abnormal findings: Secondary | ICD-10-CM | POA: Diagnosis not present

## 2021-02-24 DIAGNOSIS — Z1151 Encounter for screening for human papillomavirus (HPV): Secondary | ICD-10-CM | POA: Diagnosis not present

## 2021-02-24 DIAGNOSIS — Z113 Encounter for screening for infections with a predominantly sexual mode of transmission: Secondary | ICD-10-CM | POA: Diagnosis not present

## 2021-02-24 DIAGNOSIS — R87610 Atypical squamous cells of undetermined significance on cytologic smear of cervix (ASC-US): Secondary | ICD-10-CM | POA: Diagnosis not present

## 2021-02-24 DIAGNOSIS — Z01419 Encounter for gynecological examination (general) (routine) without abnormal findings: Secondary | ICD-10-CM

## 2021-02-24 DIAGNOSIS — Z136 Encounter for screening for cardiovascular disorders: Secondary | ICD-10-CM | POA: Diagnosis not present

## 2021-02-24 NOTE — Progress Notes (Signed)
Subjective:    Patient ID: Joy Green, female    DOB: 25-Jul-2000, 20 y.o.   MRN: [id number]  Chief Complaint  Patient presents with   Annual Exam    Pap had one a year ago and detected hpv she has been sexually active for longer than 3 years    Pt presents to the office today for CPE with pap. She is currently sexually active at this time. She reports she has had 5-6 sexually partners.  Gynecologic Exam The patient's pertinent negatives include no genital itching, vaginal bleeding or vaginal discharge. The patient is experiencing no pain.     Review of Systems  Genitourinary:  Negative for vaginal discharge.  All other systems reviewed and are negative.  Family History  Problem Relation Age of Onset   Hypertension Father    Cancer Maternal Grandmother        breast   Diabetes Paternal Grandmother    COPD Paternal Grandmother    Cancer Paternal Grandfather lung   Social History   Socioeconomic History   Marital status: Single    Spouse name: Not on file   Number of children: Not on file   Years of education: Not on file   Highest education level: Not on file  Occupational History   Not on file  Tobacco Use   Smoking status: Never   Smokeless tobacco: Current  Vaping Use   Vaping Use: Every day  Substance and Sexual Activity   Alcohol use: Yes    Comment: Occ   Drug use: Yes    Types: Marijuana   Sexual activity: Yes    Birth control/protection: Implant  Other Topics Concern   Not on file  Social History Narrative   Not on file   Social Determinants of Health   Financial Resource Strain: Not on file  Food Insecurity: Not on file  Transportation Needs: Not on file  Physical Activity: Not on file  Stress: Not on file  Social Connections: Not on file       Objective:   Physical Exam Vitals reviewed.  Constitutional:      General: She is not in acute distress.    Appearance: She is well-developed. She is obese.  HENT:     Head: Normocephalic and  atraumatic.     Right Ear: Tympanic membrane normal.     Left Ear: Tympanic membrane normal.  Eyes:     Pupils: Pupils are equal, round, and reactive to light.  Neck:     Thyroid: No thyromegaly.  Cardiovascular:     Rate and Rhythm: Normal rate and regular rhythm.     Heart sounds: Normal heart sounds. No murmur heard. Pulmonary:     Effort: Pulmonary effort is normal. No respiratory distress.     Breath sounds: Normal breath sounds. No wheezing.  Chest:  Breasts:    Right: No swelling, bleeding, inverted nipple, mass, nipple discharge, skin change or tenderness.     Left: No swelling, bleeding, inverted nipple, mass, nipple discharge, skin change or tenderness.  Abdominal:     General: Bowel sounds are normal. There is no distension.     Palpations: Abdomen is soft.     Tenderness: There is no abdominal tenderness.  Genitourinary:    General: Normal vulva.     Comments: Bimanual exam- no adnexal masses or tenderness, ovaries nonpalpable   Cervix parous and pink- No discharge  Musculoskeletal:        General: No tenderness. Normal range of  motion.     Cervical back: Normal range of motion and neck supple.  Skin:    General: Skin is warm and dry.  Neurological:     Mental Status: She is alert and oriented to person, place, and time.     Cranial Nerves: No cranial nerve deficit.     Deep Tendon Reflexes: Reflexes are normal and symmetric.  Psychiatric:        Behavior: Behavior normal.        Thought Content: Thought content normal.        Judgment: Judgment normal.    BP 124/72   Pulse 76   Temp 98.3 F (36.8 C) (Temporal)   Ht '5\' 5"'  (1.651 m)   Wt 199 lb 9.6 oz (90.5 kg)   BMI 33.22 kg/m        Assessment & Plan:  Joy Green comes in today with chief complaint of Annual Exam (Pap had one a year ago and detected hpv she has been sexually active for longer than 3 years )   Diagnosis and orders addressed:  1. Annual physical exam - CMP14+EGFR - CBC with  Differential/Platelet - Lipid panel - TSH - Cytology - PAP([place])  2. Encounter for gynecological examination without abnormal finding - CMP14+EGFR - Cytology - PAP([place])   Labs pending Health Maintenance reviewed Diet and exercise encouraged  Follow up plan: 1 year    Evelina Dun, FNP

## 2021-02-24 NOTE — Patient Instructions (Signed)
Health Maintenance, Female Adopting a healthy lifestyle and getting preventive care are important in promoting health and wellness. Ask your health care provider about: The right schedule for you to have regular tests and exams. Things you can do on your own to prevent diseases and keep yourself healthy. What should I know about diet, weight, and exercise? Eat a healthy diet  Eat a diet that includes plenty of vegetables, fruits, low-fat dairy products, and lean protein. Do not eat a lot of foods that are high in solid fats, added sugars, or sodium.  Maintain a healthy weight Body mass index (BMI) is used to identify weight problems. It estimates body fat based on height and weight. Your health care provider can help determineyour BMI and help you achieve or maintain a healthy weight. Get regular exercise Get regular exercise. This is one of the most important things you can do for your health. Most adults should: Exercise for at least 150 minutes each week. The exercise should increase your heart rate and make you sweat (moderate-intensity exercise). Do strengthening exercises at least twice a week. This is in addition to the moderate-intensity exercise. Spend less time sitting. Even light physical activity can be beneficial. Watch cholesterol and blood lipids Have your blood tested for lipids and cholesterol at 20 years of age, then havethis test every 5 years. Have your cholesterol levels checked more often if: Your lipid or cholesterol levels are high. You are older than 20 years of age. You are at high risk for heart disease. What should I know about cancer screening? Depending on your health history and family history, you may need to have cancer screening at various ages. This may include screening for: Breast cancer. Cervical cancer. Colorectal cancer. Skin cancer. Lung cancer. What should I know about heart disease, diabetes, and high blood pressure? Blood pressure and heart  disease High blood pressure causes heart disease and increases the risk of stroke. This is more likely to develop in people who have high blood pressure readings, are of African descent, or are overweight. Have your blood pressure checked: Every 3-5 years if you are 18-39 years of age. Every year if you are 40 years old or older. Diabetes Have regular diabetes screenings. This checks your fasting blood sugar level. Have the screening done: Once every three years after age 40 if you are at a normal weight and have a low risk for diabetes. More often and at a younger age if you are overweight or have a high risk for diabetes. What should I know about preventing infection? Hepatitis B If you have a higher risk for hepatitis B, you should be screened for this virus. Talk with your health care provider to find out if you are at risk forhepatitis B infection. Hepatitis C Testing is recommended for: Everyone born from 1945 through 1965. Anyone with known risk factors for hepatitis C. Sexually transmitted infections (STIs) Get screened for STIs, including gonorrhea and chlamydia, if: You are sexually active and are younger than 20 years of age. You are older than 20 years of age and your health care provider tells you that you are at risk for this type of infection. Your sexual activity has changed since you were last screened, and you are at increased risk for chlamydia or gonorrhea. Ask your health care provider if you are at risk. Ask your health care provider about whether you are at high risk for HIV. Your health care provider may recommend a prescription medicine to help   prevent HIV infection. If you choose to take medicine to prevent HIV, you should first get tested for HIV. You should then be tested every 3 months for as long as you are taking the medicine. Pregnancy If you are about to stop having your period (premenopausal) and you may become pregnant, seek counseling before you get  pregnant. Take 400 to 800 micrograms (mcg) of folic acid every day if you become pregnant. Ask for birth control (contraception) if you want to prevent pregnancy. Osteoporosis and menopause Osteoporosis is a disease in which the bones lose minerals and strength with aging. This can result in bone fractures. If you are 65 years old or older, or if you are at risk for osteoporosis and fractures, ask your health care provider if you should: Be screened for bone loss. Take a calcium or vitamin D supplement to lower your risk of fractures. Be given hormone replacement therapy (HRT) to treat symptoms of menopause. Follow these instructions at home: Lifestyle Do not use any products that contain nicotine or tobacco, such as cigarettes, e-cigarettes, and chewing tobacco. If you need help quitting, ask your health care provider. Do not use street drugs. Do not share needles. Ask your health care provider for help if you need support or information about quitting drugs. Alcohol use Do not drink alcohol if: Your health care provider tells you not to drink. You are pregnant, may be pregnant, or are planning to become pregnant. If you drink alcohol: Limit how much you use to 0-1 drink a day. Limit intake if you are breastfeeding. Be aware of how much alcohol is in your drink. In the [place] one drink equals one 12 oz bottle of beer (355 mL), one 5 oz glass of wine (148 mL), or one 1 oz glass of hard liquor (44 mL). General instructions Schedule regular health, dental, and eye exams. Stay current with your vaccines. Tell your health care provider if: You often feel depressed. You have ever been abused or do not feel safe at home. Summary Adopting a healthy lifestyle and getting preventive care are important in promoting health and wellness. Follow your health care provider's instructions about healthy diet, exercising, and getting tested or screened for diseases. Follow your health care provider's  instructions on monitoring your cholesterol and blood pressure. This information is not intended to replace advice given to you by your health care provider. Make sure you discuss any questions you have with your healthcare provider. Document Revised: 06/19/2018 Document Reviewed: 06/19/2018 Elsevier Patient Education  2022 [employer]vier Inc.  

## 2021-02-25 LAB — LIPID PANEL
Chol/HDL Ratio: 3.7 ratio (ref 0.0–4.4)
Cholesterol, Total: 134 mg/dL (ref 100–199)
HDL: 36 mg/dL — ABNORMAL LOW (ref >39)
LDL Chol Calc (NIH): 83 mg/dL (ref 0–99)
Triglycerides: 73 mg/dL (ref 0–149)
VLDL Cholesterol Cal: 15 mg/dL (ref 5–40)

## 2021-02-25 LAB — CBC WITH DIFFERENTIAL/PLATELET
Basophils Absolute: 0 10*3/uL (ref 0.0–0.2)
Basos: 0 %
EOS (ABSOLUTE): 0.2 10*3/uL (ref 0.0–0.4)
Eos: 2 %
Hematocrit: 37.6 % (ref 34.0–46.6)
Hemoglobin: 12.7 g/dL (ref 11.1–15.9)
Immature Grans (Abs): 0 10*3/uL (ref 0.0–0.1)
Immature Granulocytes: 0 %
Lymphocytes Absolute: 2.5 10*3/uL (ref 0.7–3.1)
Lymphs: 28 %
MCH: 30.2 pg (ref 26.6–33.0)
MCHC: 33.8 g/dL (ref 31.5–35.7)
MCV: 89 fL (ref 79–97)
Monocytes Absolute: 0.8 10*3/uL (ref 0.1–0.9)
Monocytes: 9 %
Neutrophils Absolute: 5.5 10*3/uL (ref 1.4–7.0)
Neutrophils: 61 %
Platelets: 309 10*3/uL (ref 150–450)
RBC: 4.21 x10E6/uL (ref 3.77–5.28)
RDW: 12.3 % (ref 11.7–15.4)
WBC: 9 10*3/uL (ref 3.4–10.8)

## 2021-02-25 LAB — CMP14+EGFR
ALT: 10 IU/L (ref 0–32)
AST: 15 IU/L (ref 0–40)
Albumin/Globulin Ratio: 1.4 (ref 1.2–2.2)
Albumin: 3.8 g/dL — ABNORMAL LOW (ref 3.9–5.0)
Alkaline Phosphatase: 105 IU/L (ref 42–106)
BUN/Creatinine Ratio: 17 (ref 9–23)
BUN: 12 mg/dL (ref 6–20)
Bilirubin Total: <0.2 mg/dL (ref 0.0–1.2)
CO2: 23 mmol/L (ref 20–29)
Calcium: 9 mg/dL (ref 8.7–10.2)
Chloride: 105 mmol/L (ref 96–106)
Creatinine, Ser: 0.69 mg/dL (ref 0.57–1.00)
Globulin, Total: 2.8 g/dL (ref 1.5–4.5)
Glucose: 86 mg/dL (ref 65–99)
Potassium: 4.1 mmol/L (ref 3.5–5.2)
Sodium: 139 mmol/L (ref 134–144)
Total Protein: 6.6 g/dL (ref 6.0–8.5)
eGFR: 127 mL/min/{1.73_m2} (ref >59)

## 2021-02-25 LAB — TSH: TSH: 1.75 u[IU]/mL (ref 0.450–4.500)

## 2021-03-09 ENCOUNTER — Telehealth: Payer: Self-pay | Admitting: Family Medicine

## 2021-03-10 NOTE — Telephone Encounter (Signed)
Patient aware and verbalized understanding. Patient aware pap not back yet.

## 2021-03-11 LAB — CYTOLOGY - PAP
Chlamydia: NEGATIVE
Comment: NEGATIVE
Comment: NEGATIVE
Comment: NEGATIVE
Comment: NEGATIVE
Comment: NORMAL
Diagnosis: UNDETERMINED — AB
HSV1: NEGATIVE
HSV2: NEGATIVE
High risk HPV: NEGATIVE
Neisseria Gonorrhea: NEGATIVE
Trichomonas: NEGATIVE

## 2021-04-14 ENCOUNTER — Encounter: Payer: Self-pay | Admitting: Family Medicine

## 2021-04-14 ENCOUNTER — Ambulatory Visit: Payer: BC Managed Care – PPO | Admitting: Family Medicine

## 2021-04-14 ENCOUNTER — Other Ambulatory Visit: Payer: Self-pay

## 2021-04-14 VITALS — BP 115/57 | HR 77 | Temp 97.6°F | Ht 65.0 in | Wt 198.6 lb

## 2021-04-14 DIAGNOSIS — F339 Major depressive disorder, recurrent, unspecified: Secondary | ICD-10-CM

## 2021-04-14 DIAGNOSIS — F419 Anxiety disorder, unspecified: Secondary | ICD-10-CM

## 2021-04-14 MED ORDER — BUPROPION HCL ER (XL) 150 MG PO TB24: 150.0000 mg | ORAL_TABLET | Freq: Every day | ORAL | 1 refills | Status: DC

## 2021-04-14 NOTE — Progress Notes (Signed)
BP (!) 115/57   Pulse 77   Temp 97.6 F (36.4 C)   Ht 5\' 5"  (1.651 m)   Wt 198 lb 9.6 oz (90.1 kg)   SpO2 99%   BMI 33.05 kg/m    Subjective:   Patient ID: , female    DOB: Jul 12, 2000, 20 y.o.   MRN: [id number]  HPI: Joy Green is a 20 y.o. female presenting on 04/14/2021 for Depression and Anxiety   HPI Depression and anxiety recheck Patient is coming in for depression and anxiety recheck.  She is currently taking sertraline 100 mg daily.  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  She just feels like the anxiety is building all the time and she is not eating as well and just struggling with it.  She wants something to help with anxiety.  She feels like her depression is doing better with the Zoloft she does not want to change that but wants something to help additionally with anxiety.  She also admits she is down on energy and just feeling tired all of the time. Depression screen [facility] 2/9 04/14/2021 04/14/2021 02/24/2021 01/07/2021 07/14/2020  Decreased Interest 1 0 0 0 1  Down, Depressed, Hopeless 2 0 1 2 1   PHQ - 2 Score 3 0 1 2 2   Altered sleeping 1 - 0 0 -  Tired, decreased energy 3 - 1 1 -  Change in appetite 2 - 1 1 -  Feeling bad or failure about yourself  2 - 1 1 -  Trouble concentrating 0 - 0 2 -  Moving slowly or fidgety/restless 0 - 0 0 -  Suicidal thoughts 1 - 1 0 -  PHQ-9 Score 12 - 5 7 -  Difficult doing work/chores Somewhat difficult - Somewhat difficult Somewhat difficult -  Some recent data might be hidden     Relevant past medical, surgical, family and social history reviewed and updated as indicated. Interim medical history since our last visit reviewed. Allergies and medications reviewed and updated.  Review of Systems  Constitutional:  Positive for appetite change. Negative for chills and fever.  Eyes:  Negative for visual disturbance.   Respiratory:  Negative for chest tightness and shortness of breath.   Cardiovascular:  Negative for chest pain and leg swelling.  Musculoskeletal:  Negative for back pain and gait problem.  Skin:  Negative for rash.  Neurological:  Negative for light-headedness and headaches.  Psychiatric/Behavioral:  Positive for dysphoric mood. Negative for agitation, behavioral problems, self-injury, sleep disturbance and suicidal ideas. The patient is nervous/anxious.   All other systems reviewed and are negative.  Per HPI unless specifically indicated above   Allergies as of 04/14/2021   No Known Allergies      Medication List        Accurate as of April 14, 2021  3:09 PM. If you have any questions, ask your nurse or doctor.          buPROPion 150 MG 24 hr tablet Commonly known as: Wellbutrin XL Take 1 tablet (150 mg total) by mouth daily. Started by: Demetrick Eichenberger, MD   ibuprofen 200 MG tablet Commonly known as: ADVIL Take 400 mg by mouth as needed.   sertraline 100 MG tablet Commonly known as: ZOLOFT Take 1 tablet (100 mg total) by mouth daily.         Objective:  BP (!) 115/57   Pulse 77   Temp 97.6 F (36.4 C)   Ht 5\' 5"  (1.651 m)   Wt 198 lb 9.6 oz (90.1 kg)   SpO2 99%   BMI 33.05 kg/m   Wt Readings from Last 3 Encounters:  04/14/21 198 lb 9.6 oz (90.1 kg)  02/24/21 199 lb 9.6 oz (90.5 kg)  01/07/21 202 lb 3.2 oz (91.7 kg)    Physical Exam Vitals and nursing note reviewed.  Constitutional:      General: She is not in acute distress.    Appearance: She is well-developed. She is not diaphoretic.  Eyes:     Conjunctiva/sclera: Conjunctivae normal.  Abdominal:     General: Abdomen is flat. Bowel sounds are normal. There is no distension.     Tenderness: There is no abdominal tenderness. There is no guarding.  Musculoskeletal:        General: No tenderness. Normal range of motion.  Skin:    General: Skin is warm and dry.     Findings: No rash.   Neurological:     Mental Status: She is alert and oriented to person, place, and time.     Coordination: Coordination normal.  Psychiatric:        Mood and Affect: Mood is anxious and depressed.        Behavior: Behavior normal.        Thought Content: Thought content does not include suicidal ideation. Thought content does not include suicidal plan.      Assessment & Plan:   Problem List Items Addressed This Visit   None Visit Diagnoses     Anxiety    -  Primary   Relevant Medications   buPROPion (WELLBUTRIN XL) 150 MG 24 hr tablet   Depression, recurrent (HCC)       Relevant Medications   buPROPion (WELLBUTRIN XL) 150 MG 24 hr tablet       We will add Wellbutrin for her, patient was to come in and have her Nexplanon removal and get a Mirena. Follow up plan: Return in about 4 weeks (around 05/12/2021), or if symptoms worsen or fail to improve, for anxie, depression and Nexplanon removal and IUD insertion.  Counseling provided for all of the vaccine components No orders of the defined types were placed in this encounter.   13/09/2020, MD [facility] 04/14/2021, 3:09 PM

## 2021-05-06 ENCOUNTER — Other Ambulatory Visit: Payer: Self-pay | Admitting: Family Medicine

## 2021-05-06 DIAGNOSIS — F419 Anxiety disorder, unspecified: Secondary | ICD-10-CM

## 2021-05-06 DIAGNOSIS — F339 Major depressive disorder, recurrent, unspecified: Secondary | ICD-10-CM

## 2021-05-16 ENCOUNTER — Ambulatory Visit: Payer: BC Managed Care – PPO | Admitting: Family Medicine

## 2021-05-16 ENCOUNTER — Encounter: Payer: Self-pay | Admitting: Family Medicine

## 2021-05-16 ENCOUNTER — Other Ambulatory Visit: Payer: Self-pay

## 2021-05-16 VITALS — BP 118/68 | HR 93 | Ht 65.0 in | Wt 198.0 lb

## 2021-05-16 DIAGNOSIS — F339 Major depressive disorder, recurrent, unspecified: Secondary | ICD-10-CM | POA: Diagnosis not present

## 2021-05-16 DIAGNOSIS — Z30430 Encounter for insertion of intrauterine contraceptive device: Secondary | ICD-10-CM | POA: Diagnosis not present

## 2021-05-16 DIAGNOSIS — F419 Anxiety disorder, unspecified: Secondary | ICD-10-CM | POA: Diagnosis not present

## 2021-05-16 DIAGNOSIS — Z3046 Encounter for surveillance of implantable subdermal contraceptive: Secondary | ICD-10-CM | POA: Diagnosis not present

## 2021-05-16 MED ORDER — BUPROPION HCL ER (XL) 300 MG PO TB24: 300.0000 mg | ORAL_TABLET | Freq: Every day | ORAL | 2 refills | Status: DC

## 2021-05-16 MED ORDER — LEVONORGESTREL 20 MCG/DAY IU IUD: 1.0000 | INTRAUTERINE_SYSTEM | Freq: Once | INTRAUTERINE | Status: AC

## 2021-05-16 NOTE — Progress Notes (Signed)
BP 118/68   Pulse 93   Ht 5\' 5"  (1.651 m)   Wt 198 lb (89.8 kg)   LMP 04/25/2021   SpO2 98%   BMI 32.95 kg/m    Subjective:   Patient ID: Joy FowlerSydney G Formby, female    DOB: 27-Jan-2001, 20 y.o.   MRN: [id number]  HPI: Joy Green is a 20 y.o. female presenting on 05/16/2021 for Contraceptive Management (Removal of Nexplanon and insertion of Mirena)   HPI Anxiety depression recheck Patient is coming in today for anxiety depression recheck.  She says Wellbutrin did well for her first and that seemed to taper off and not work as well.  She denies any major side effects outside of what she had in the first week from it.  Patient denies any suicidal ideations.  Patient is coming in today for IUD insertion, she wants Mirena.  She had Nexplanon and she wants to get that removed and put the IUD in.  She had a lot of weight gain with the Nexplanon   Relevant past medical, surgical, family and social history reviewed and updated as indicated. Interim medical history since our last visit reviewed. Allergies and medications reviewed and updated.  Review of Systems  Constitutional:  Negative for chills and fever.  Eyes:  Negative for visual disturbance.  Respiratory:  Negative for chest tightness and shortness of breath.   Cardiovascular:  Negative for chest pain and leg swelling.  Musculoskeletal:  Negative for back pain and gait problem.  Skin:  Negative for rash.  Neurological:  Negative for light-headedness and headaches.  Psychiatric/Behavioral:  Positive for dysphoric mood. Negative for agitation, behavioral problems, self-injury, sleep disturbance and suicidal ideas. The patient is nervous/anxious.   All other systems reviewed and are negative.  Per HPI unless specifically indicated above   Allergies as of 05/16/2021   No Known Allergies      Medication List        Accurate as of May 16, 2021  2:47 PM. If you have any questions, ask your nurse or doctor.           buPROPion 300 MG 24 hr tablet Commonly known as: WELLBUTRIN XL Take 1 tablet (300 mg total) by mouth daily. What changed:  medication strength how much to take Changed by: Elige RadonJoshua A Sueo Cullen, MD   ibuprofen 200 MG tablet Commonly known as: ADVIL Take 400 mg by mouth as needed.   sertraline 100 MG tablet Commonly known as: ZOLOFT Take 1 tablet (100 mg total) by mouth daily.         Objective:   BP 118/68   Pulse 93   Ht 5\' 5"  (1.651 m)   Wt 198 lb (89.8 kg)   LMP 04/25/2021   SpO2 98%   BMI 32.95 kg/m   Wt Readings from Last 3 Encounters:  05/16/21 198 lb (89.8 kg)  04/14/21 198 lb 9.6 oz (90.1 kg)  02/24/21 199 lb 9.6 oz (90.5 kg)    Physical Exam Vitals and nursing note reviewed.  Constitutional:      General: She is not in acute distress.    Appearance: Normal appearance. She is well-developed. She is not diaphoretic.  Eyes:     Conjunctiva/sclera: Conjunctivae normal.  Neck:     Thyroid: No thyromegaly.  Cardiovascular:     Rate and Rhythm: Normal rate and regular rhythm.     Heart sounds: Normal heart sounds. No murmur heard. Pulmonary:     Effort: Pulmonary effort is normal.  No respiratory distress.     Breath sounds: Normal breath sounds. No wheezing.  Chest:  Breasts:    Breasts are symmetrical.     Right: No inverted nipple, mass, nipple discharge, skin change or tenderness.     Left: No inverted nipple, mass, nipple discharge, skin change or tenderness.  Abdominal:     General: Bowel sounds are normal. There is no distension.     Palpations: Abdomen is soft.     Tenderness: There is no abdominal tenderness. There is no guarding or rebound.  Genitourinary:    Exam position: Supine.     Labia:        Right: No rash or lesion.        Left: No rash or lesion.      Vagina: Normal.     Cervix: No cervical motion tenderness, discharge or friability.     Uterus: Not deviated, not enlarged, not fixed and not tender.      Adnexa:        Right:  No mass or tenderness.         Left: No mass or tenderness.    Musculoskeletal:        General: No swelling. Normal range of motion.     Cervical back: Neck supple.  Lymphadenopathy:     Cervical: No cervical adenopathy.  Skin:    General: Skin is warm and dry.     Findings: No rash.  Neurological:     Mental Status: She is alert and oriented to person, place, and time.     Coordination: Coordination normal.  Psychiatric:        Behavior: Behavior normal.    Nexplanon removal: Nexplanon is palpable on her left upper inner arm. Site was prepped with Betadine. 41mL of 2% lidocaine without epinephrine were used for anesthesia. 15 blade was used to dissect down to 1 and of the device. Forceps were used to grab the end of the device and extracted which came out intact and whole. Steri-Strips were placed to close the small incision. Pressure dressing was placed over the site to prevent bruising. Patient tolerated procedure well and bleeding was minimal.   IUD insertion procedure: Patient was placed in stirrups and use a speculum. Patient was prepped with Betadine swabs using cotton balls. Tenaculum was used to grab the anterior cervix. Uterine sound was performed and found to be 6cm in length and introverted. Cervical dilation was necessary. Mirena was placed using factory device at the correct depth that was measured and deployed without issue. The strings were cut leaving 1.5 cm extra. Patient tolerated procedure well and bleeding was minimal.   Assessment & Plan:   Problem List Items Addressed This Visit   None Visit Diagnoses     Encounter for IUD insertion    -  Primary   Encounter for Nexplanon removal       Anxiety       Relevant Medications   buPROPion (WELLBUTRIN XL) 300 MG 24 hr tablet   Depression, recurrent (HCC)       Relevant Medications   buPROPion (WELLBUTRIN XL) 300 MG 24 hr tablet        Follow up plan: Return in about 4 weeks (around 06/13/2021), or if symptoms  worsen or fail to improve, for IUD recheck and anxiety and depression.  Counseling provided for all of the vaccine components No orders of the defined types were placed in this encounter.   Arville Care, MD [facility] 05/16/2021, 2:47 PM

## 2021-06-08 ENCOUNTER — Other Ambulatory Visit: Payer: Self-pay | Admitting: Family Medicine

## 2021-06-08 DIAGNOSIS — F419 Anxiety disorder, unspecified: Secondary | ICD-10-CM

## 2021-06-08 DIAGNOSIS — F339 Major depressive disorder, recurrent, unspecified: Secondary | ICD-10-CM

## 2021-06-13 ENCOUNTER — Encounter: Payer: Self-pay | Admitting: Family Medicine

## 2021-06-13 ENCOUNTER — Ambulatory Visit (INDEPENDENT_AMBULATORY_CARE_PROVIDER_SITE_OTHER): Payer: BC Managed Care – PPO | Admitting: Family Medicine

## 2021-06-13 ENCOUNTER — Other Ambulatory Visit: Payer: Self-pay | Admitting: Family Medicine

## 2021-06-13 VITALS — BP 121/61 | HR 79 | Ht 65.0 in | Wt 193.0 lb

## 2021-06-13 DIAGNOSIS — Z975 Presence of (intrauterine) contraceptive device: Secondary | ICD-10-CM

## 2021-06-13 DIAGNOSIS — F339 Major depressive disorder, recurrent, unspecified: Secondary | ICD-10-CM

## 2021-06-13 DIAGNOSIS — F419 Anxiety disorder, unspecified: Secondary | ICD-10-CM

## 2021-06-13 MED ORDER — BUPROPION HCL ER (XL) 150 MG PO TB24: 150.0000 mg | ORAL_TABLET | Freq: Every day | ORAL | 5 refills | Status: DC

## 2021-06-13 NOTE — Progress Notes (Signed)
BP 121/61   Pulse 79   Ht 5\' 5"  (1.651 m)   Wt 193 lb (87.5 kg)   SpO2 97%   BMI 32.12 kg/m    Subjective:   Patient ID: , female    DOB: [birth year], 20 y.o.   MRN: [id number]  HPI: Joy Green is a 20 y.o. female presenting on 06/13/2021 for iud follow up (IUD placed at last visit. Continues to have light bleeding)   HPI IUD recheck Patient is coming in today for IUD recheck.  She still having her spotting its been 1 month and has not necessarily lightened up.  She says it is more like a light period, slightly more than spotting but not like a full.  She says she had a couple of days of relief from it but she has noticed her weight coming down and has not noticed any pain or problems with intercourse with the IUD.  Anxiety recheck Patient currently takes Wellbutrin and Zoloft help with anxiety.  When we increased the dose she was having trouble sleeping. She is back at fast food work at 14/11/2020. She would like to go back to 150 mg.  Depression screen [facility] 2/9 06/13/2021 06/13/2021 04/14/2021 04/14/2021 02/24/2021  Decreased Interest - 1 1 0 0  Down, Depressed, Hopeless 3 2 2  0 1  PHQ - 2 Score 3 3 3  0 1  Altered sleeping 3 1 1  - 0  Tired, decreased energy 1 3 3  - 1  Change in appetite 3 2 2  - 1  Feeling bad or failure about yourself  3 2 2  - 1  Trouble concentrating 1 0 0 - 0  Moving slowly or fidgety/restless 0 0 0 - 0  Suicidal thoughts 1 1 1  - 1  PHQ-9 Score 15 12 12  - 5  Difficult doing work/chores - - Somewhat difficult - Somewhat difficult  Some recent data might be hidden      Relevant past medical, surgical, family and social history reviewed and updated as indicated. Interim medical history since our last visit reviewed. Allergies and medications reviewed and updated.  Review of Systems  Constitutional:  Negative for chills and fever.  Eyes:  Negative for visual disturbance.  Respiratory:  Negative for chest tightness and shortness of breath.    Cardiovascular:  Negative for chest pain and leg swelling.  Gastrointestinal:  Negative for abdominal pain.  Genitourinary:  Positive for menstrual problem and vaginal discharge. Negative for dysuria, vaginal bleeding and vaginal pain.  Musculoskeletal:  Negative for back pain and gait problem.  Skin:  Negative for rash.  Neurological:  Negative for light-headedness and headaches.  Psychiatric/Behavioral:  Positive for dysphoric mood. Negative for agitation, behavioral problems, self-injury, sleep disturbance and suicidal ideas. The patient is nervous/anxious.   All other systems reviewed and are negative.  Per HPI unless specifically indicated above   Allergies as of 06/13/2021   No Known Allergies      Medication List        Accurate as of June 13, 2021  2:11 PM. If you have any questions, ask your nurse or doctor.          buPROPion 150 MG 24 hr tablet Commonly known as: Wellbutrin XL Take 1 tablet (150 mg total) by mouth daily. What changed:  medication strength how much to take Changed by: Joy Craghead, MD   ibuprofen 200 MG tablet Commonly known as: ADVIL Take 400 mg by mouth as needed.   sertraline  100 MG tablet Commonly known as: ZOLOFT Take 1 tablet (100 mg total) by mouth daily.         Objective:   BP 121/61   Pulse 79   Ht 5\' 5"  (1.651 m)   Wt 193 lb (87.5 kg)   SpO2 97%   BMI 32.12 kg/m   Wt Readings from Last 3 Encounters:  06/13/21 193 lb (87.5 kg)  05/16/21 198 lb (89.8 kg)  04/14/21 198 lb 9.6 oz (90.1 kg)    Physical Exam Vitals and nursing note reviewed. Exam conducted with a chaperone present.  Constitutional:      Appearance: Normal appearance.  Genitourinary:    Exam position: Lithotomy position.     Vagina: Bleeding present.     Cervix: Cervical bleeding present. No cervical motion tenderness or erythema.     Uterus: Normal. Not enlarged, not fixed and not tender.      Adnexa: Right adnexa normal and left adnexa  normal.  Neurological:     Mental Status: She is alert.      Assessment & Plan:   Problem List Items Addressed This Visit   None Visit Diagnoses     Anxiety    -  Primary   Relevant Medications   buPROPion (WELLBUTRIN XL) 150 MG 24 hr tablet   IUD (intrauterine device) in place       Depression, recurrent (HCC)       Relevant Medications   buPROPion (WELLBUTRIN XL) 150 MG 24 hr tablet       Will go back to Wellbutrin 150 mg because she was not sleeping.  Follow up plan: Return in about 3 months (around 09/11/2021), or if symptoms worsen or fail to improve, for Anxiety and depression.  Counseling provided for all of the vaccine components No orders of the defined types were placed in this encounter.   Caryl Pina, MD [facility] 06/13/2021, 2:11 PM

## 2021-07-07 ENCOUNTER — Other Ambulatory Visit: Payer: Self-pay | Admitting: Family Medicine

## 2021-07-07 DIAGNOSIS — F419 Anxiety disorder, unspecified: Secondary | ICD-10-CM

## 2021-07-07 DIAGNOSIS — F339 Major depressive disorder, recurrent, unspecified: Secondary | ICD-10-CM

## 2021-08-11 ENCOUNTER — Other Ambulatory Visit: Payer: Self-pay | Admitting: Family Medicine

## 2021-08-11 DIAGNOSIS — F419 Anxiety disorder, unspecified: Secondary | ICD-10-CM

## 2021-08-11 DIAGNOSIS — F339 Major depressive disorder, recurrent, unspecified: Secondary | ICD-10-CM

## 2021-09-12 ENCOUNTER — Ambulatory Visit: Payer: BC Managed Care – PPO | Admitting: Family Medicine

## 2021-09-13 ENCOUNTER — Encounter: Payer: Self-pay | Admitting: Family Medicine

## 2021-10-14 ENCOUNTER — Other Ambulatory Visit: Payer: Self-pay | Admitting: Family Medicine

## 2021-10-14 DIAGNOSIS — F419 Anxiety disorder, unspecified: Secondary | ICD-10-CM

## 2021-10-14 DIAGNOSIS — F339 Major depressive disorder, recurrent, unspecified: Secondary | ICD-10-CM

## 2021-10-17 NOTE — Telephone Encounter (Signed)
Needs to make a 3 mth follow up with pcp per last note  ?

## 2021-10-17 NOTE — Telephone Encounter (Signed)
Appt made for 5/26

## 2021-10-25 ENCOUNTER — Ambulatory Visit (INDEPENDENT_AMBULATORY_CARE_PROVIDER_SITE_OTHER): Payer: BC Managed Care – PPO | Admitting: Emergency Medicine

## 2021-10-25 DIAGNOSIS — Z111 Encounter for screening for respiratory tuberculosis: Secondary | ICD-10-CM | POA: Diagnosis not present

## 2021-10-27 ENCOUNTER — Ambulatory Visit (INDEPENDENT_AMBULATORY_CARE_PROVIDER_SITE_OTHER): Payer: BC Managed Care – PPO | Admitting: Emergency Medicine

## 2021-10-27 LAB — TB SKIN TEST
Induration: 0 mm
TB Skin Test: NEGATIVE

## 2021-11-03 ENCOUNTER — Encounter: Payer: Self-pay | Admitting: Family Medicine

## 2021-11-03 ENCOUNTER — Ambulatory Visit: Payer: BC Managed Care – PPO | Admitting: Family Medicine

## 2021-11-03 VITALS — BP 116/68 | HR 86 | Temp 98.6°F | Ht 65.0 in | Wt 182.0 lb

## 2021-11-03 DIAGNOSIS — F419 Anxiety disorder, unspecified: Secondary | ICD-10-CM

## 2021-11-03 DIAGNOSIS — Z0289 Encounter for other administrative examinations: Secondary | ICD-10-CM

## 2021-11-03 DIAGNOSIS — Z021 Encounter for pre-employment examination: Secondary | ICD-10-CM

## 2021-11-03 DIAGNOSIS — F339 Major depressive disorder, recurrent, unspecified: Secondary | ICD-10-CM

## 2021-11-03 MED ORDER — FLUOXETINE HCL 20 MG PO CAPS: 20.0000 mg | ORAL_CAPSULE | Freq: Every day | ORAL | 3 refills | Status: DC

## 2021-11-03 NOTE — Patient Instructions (Signed)

## 2021-11-03 NOTE — Progress Notes (Signed)
?  ? ?Subjective:  ?Patient ID: Joy Green, female    DOB: 26-Jan-2001, 21 y.o.   MRN: [id number] ? ?Patient Care Team: ?Dettinger, Elige Radon, MD as PCP - General (Family Medicine)  ? ?Chief Complaint:  Medical Clearance ? ? ?HPI: ?Joy Green is a 21 y.o. female presenting on 11/03/2021 for Medical Clearance ? ? ?Pt presents today to have employment form completed. Pt also reports she stopped taking her anxiety and depressive medications due to side effects. States her depression seems to be ok but her anxiety has increased significantly. She denies SI or HI.  ? ? ?  11/03/2021  ?  3:01 PM 06/13/2021  ?  1:48 PM 06/13/2021  ?  1:46 PM 04/14/2021  ?  2:22 PM 04/14/2021  ?  2:14 PM  ?Depression screen PHQ 2/9  ?Decreased Interest 1  1 1  0  ?Down, Depressed, Hopeless 1 3 2 2  0  ?PHQ - 2 Score 2 3 3 3  0  ?Altered sleeping 2 3 1 1    ?Tired, decreased energy 1 1 3 3    ?Change in appetite 2 3 2 2    ?Feeling bad or failure about yourself  3 3 2 2    ?Trouble concentrating 3 1 0 0   ?Moving slowly or fidgety/restless 0 0 0 0   ?Suicidal thoughts 0 1 1 1    ?PHQ-9 Score 13 15 12 12    ?Difficult doing work/chores Somewhat difficult   Somewhat difficult   ? ? ?  11/03/2021  ?  3:02 PM 06/13/2021  ?  1:46 PM 04/14/2021  ?  2:22 PM 02/24/2021  ?  3:42 PM  ?GAD 7 : Generalized Anxiety Score  ?Nervous, Anxious, on Edge 3 3 3 2   ?Control/stop worrying 3 3 2 2   ?Worry too much - different things 3 3 2 1   ?Trouble relaxing 2 3 2 1   ?Restless 1 1 1  0  ?Easily annoyed or irritable 2 2 1 1   ?Afraid - awful might happen 3 1 2 1   ?Total GAD 7 Score 17 16 13 8   ?Anxiety Difficulty Somewhat difficult  Somewhat difficult   ? ? ? ? ? ?Relevant past medical, surgical, family, and social history reviewed and updated as indicated.  ?Allergies and medications reviewed and updated. Data reviewed: Chart in Epic. ? ? ?Past Medical History:  ?Diagnosis Date  ? Anxiety   ? Depression   ? ? ?History reviewed. No pertinent surgical history. ? ?Social History   ? ?Socioeconomic History  ? Marital status: Single  ?  Spouse name: Not on file  ? Number of children: Not on file  ? Years of education: Not on file  ? Highest education level: Not on file  ?Occupational History  ? Not on file  ?Tobacco Use  ? Smoking status: Never  ? Smokeless tobacco: Current  ?Vaping Use  ? Vaping Use: Every day  ?Substance and Sexual Activity  ? Alcohol use: Yes  ?  Comment: Occ  ? Drug use: Yes  ?  Types: Marijuana  ? Sexual activity: Yes  ?  Birth control/protection: Implant  ?Other Topics Concern  ? Not on file  ?Social History Narrative  ? Not on file  ? ?Social Determinants of Health  ? ?Financial Resource Strain: Not on file  ?Food Insecurity: Not on file  ?Transportation Needs: Not on file  ?Physical Activity: Not on file  ?Stress: Not on file  ?Social Connections: Not on file  ?Intimate Partner Violence: Not  on file  ? ? ?Outpatient Encounter Medications as of 11/03/2021  ?Medication Sig  ? FLUoxetine (PROZAC) 20 MG capsule Take 1 capsule (20 mg total) by mouth at bedtime.  ? ibuprofen (ADVIL,MOTRIN) 200 MG tablet Take 400 mg by mouth as needed.  (Patient not taking: Reported on 11/03/2021)  ? [DISCONTINUED] buPROPion (WELLBUTRIN XL) 150 MG 24 hr tablet TAKE 1 TABLET BY MOUTH EVERY DAY (Patient not taking: Reported on 11/03/2021)  ? [DISCONTINUED] sertraline (ZOLOFT) 100 MG tablet TAKE 1 TABLET BY MOUTH EVERY DAY (Patient not taking: Reported on 11/03/2021)  ? ?Facility-Administered Encounter Medications as of 11/03/2021  ?Medication  ? levonorgestrel (MIRENA) 20 MCG/DAY IUD 1 each  ? ? ?No Known Allergies ? ?Review of Systems  ?Constitutional:  Negative for activity change, appetite change, chills, diaphoresis, fatigue, fever and unexpected weight change.  ?HENT: Negative.    ?Eyes: Negative.  Negative for photophobia and visual disturbance.  ?Respiratory:  Negative for cough, chest tightness and shortness of breath.   ?Cardiovascular:  Negative for chest pain, palpitations and leg  swelling.  ?Gastrointestinal:  Negative for abdominal pain, blood in stool, constipation, diarrhea, nausea and vomiting.  ?Endocrine: Negative.  Negative for cold intolerance, heat intolerance, polydipsia, polyphagia and polyuria.  ?Genitourinary:  Negative for decreased urine volume, difficulty urinating, dysuria, frequency and urgency.  ?Musculoskeletal:  Negative for arthralgias and myalgias.  ?Skin: Negative.   ?Allergic/Immunologic: Negative.   ?Neurological:  Negative for dizziness, tremors, seizures, syncope, facial asymmetry, speech difficulty, weakness, light-headedness, numbness and headaches.  ?Hematological: Negative.   ?Psychiatric/Behavioral:  Negative for confusion, hallucinations, sleep disturbance and suicidal ideas.   ?All other systems reviewed and are negative. ? ?   ? ?Objective:  ?BP 116/68   Pulse 86   Temp 98.6 ?F (37 ?C)   Ht 5\' 5"  (1.651 m)   Wt 182 lb (82.6 kg)   SpO2 96%   BMI 30.29 kg/m?   ? ?Wt Readings from Last 3 Encounters:  ?11/03/21 182 lb (82.6 kg)  ?06/13/21 193 lb (87.5 kg)  ?05/16/21 198 lb (89.8 kg)  ? ? ?Physical Exam ?Vitals and nursing note reviewed.  ?Constitutional:   ?   General: She is not in acute distress. ?   Appearance: Normal appearance. She is well-developed and well-groomed. She is obese. She is not ill-appearing, toxic-appearing or diaphoretic.  ?HENT:  ?   Head: Normocephalic and atraumatic.  ?   Jaw: There is normal jaw occlusion.  ?   Right Ear: Hearing, tympanic membrane, ear canal and external ear normal.  ?   Left Ear: Hearing, tympanic membrane, ear canal and external ear normal.  ?   Nose: Nose normal.  ?   Mouth/Throat:  ?   Lips: Pink.  ?   Mouth: Mucous membranes are moist.  ?   Pharynx: Oropharynx is clear. Uvula midline.  ?Eyes:  ?   General: Lids are normal.  ?   Extraocular Movements: Extraocular movements intact.  ?   Conjunctiva/sclera: Conjunctivae normal.  ?   Pupils: Pupils are equal, round, and reactive to light.  ?Neck:  ?   Thyroid:  No thyroid mass, thyromegaly or thyroid tenderness.  ?   Vascular: No carotid bruit or JVD.  ?   Trachea: Trachea and phonation normal.  ?Cardiovascular:  ?   Rate and Rhythm: Normal rate and regular rhythm.  ?   Chest Wall: PMI is not displaced.  ?   Pulses: Normal pulses.  ?   Heart sounds: Normal heart sounds.  No murmur heard. ?  No friction rub. No gallop.  ?Pulmonary:  ?   Effort: Pulmonary effort is normal. No respiratory distress.  ?   Breath sounds: Normal breath sounds. No wheezing.  ?Abdominal:  ?   General: Bowel sounds are normal. There is no distension or abdominal bruit.  ?   Palpations: Abdomen is soft. There is no hepatomegaly or splenomegaly.  ?   Tenderness: There is no abdominal tenderness. There is no right CVA tenderness or left CVA tenderness.  ?   Hernia: No hernia is present.  ?Musculoskeletal:     ?   General: Normal range of motion.  ?   Cervical back: Normal range of motion and neck supple.  ?   Right lower leg: No edema.  ?   Left lower leg: No edema.  ?Lymphadenopathy:  ?   Cervical: No cervical adenopathy.  ?Skin: ?   General: Skin is warm and dry.  ?   Capillary Refill: Capillary refill takes less than 2 seconds.  ?   Coloration: Skin is not cyanotic, jaundiced or pale.  ?   Findings: No rash.  ?Neurological:  ?   General: No focal deficit present.  ?   Mental Status: She is alert and oriented to person, place, and time.  ?   Sensory: Sensation is intact.  ?   Motor: Motor function is intact.  ?   Coordination: Coordination is intact.  ?   Gait: Gait is intact.  ?   Deep Tendon Reflexes: Reflexes are normal and symmetric.  ?Psychiatric:     ?   Attention and Perception: Attention and perception normal.     ?   Mood and Affect: Mood is depressed. Affect is flat.     ?   Speech: Speech normal.     ?   Behavior: Behavior normal. Behavior is cooperative.     ?   Thought Content: Thought content normal.     ?   Cognition and Memory: Cognition and memory normal.     ?   Judgment: Judgment  normal.  ? ? ?Results for orders placed or performed in visit on 10/25/21  ?PPD  ?Result Value Ref Range  ? TB Skin Test Negative   ? Induration 0 mm  ? ?   ? ?Pertinent labs & imaging results that were available during

## 2021-11-04 LAB — CBC WITH DIFFERENTIAL/PLATELET
Basophils Absolute: 0 10*3/uL (ref 0.0–0.2)
Basos: 1 %
EOS (ABSOLUTE): 0.1 10*3/uL (ref 0.0–0.4)
Eos: 2 %
Hematocrit: 40.1 % (ref 34.0–46.6)
Hemoglobin: 13.7 g/dL (ref 11.1–15.9)
Immature Grans (Abs): 0 10*3/uL (ref 0.0–0.1)
Immature Granulocytes: 0 %
Lymphocytes Absolute: 1.9 10*3/uL (ref 0.7–3.1)
Lymphs: 32 %
MCH: 30.8 pg (ref 26.6–33.0)
MCHC: 34.2 g/dL (ref 31.5–35.7)
MCV: 90 fL (ref 79–97)
Monocytes Absolute: 0.4 10*3/uL (ref 0.1–0.9)
Monocytes: 7 %
Neutrophils Absolute: 3.5 10*3/uL (ref 1.4–7.0)
Neutrophils: 58 %
Platelets: 299 10*3/uL (ref 150–450)
RBC: 4.45 x10E6/uL (ref 3.77–5.28)
RDW: 12.6 % (ref 11.7–15.4)
WBC: 6.1 10*3/uL (ref 3.4–10.8)

## 2021-11-04 LAB — BMP8+EGFR
BUN/Creatinine Ratio: 14 (ref 9–23)
BUN: 10 mg/dL (ref 6–20)
CO2: 23 mmol/L (ref 20–29)
Calcium: 9.4 mg/dL (ref 8.7–10.2)
Chloride: 104 mmol/L (ref 96–106)
Creatinine, Ser: 0.72 mg/dL (ref 0.57–1.00)
Glucose: 110 mg/dL — ABNORMAL HIGH (ref 70–99)
Potassium: 4.4 mmol/L (ref 3.5–5.2)
Sodium: 140 mmol/L (ref 134–144)
eGFR: 122 mL/min/{1.73_m2} (ref >59)

## 2021-11-04 LAB — THYROID PANEL WITH TSH
Free Thyroxine Index: 1.9 (ref 1.2–4.9)
T3 Uptake Ratio: 27 % (ref 24–39)
T4, Total: 7.2 ug/dL (ref 4.5–12.0)
TSH: 3.11 u[IU]/mL (ref 0.450–4.500)

## 2021-11-14 ENCOUNTER — Encounter: Payer: Self-pay | Admitting: Nurse Practitioner

## 2021-11-14 ENCOUNTER — Ambulatory Visit (INDEPENDENT_AMBULATORY_CARE_PROVIDER_SITE_OTHER): Payer: BC Managed Care – PPO | Admitting: Nurse Practitioner

## 2021-11-14 VITALS — BP 111/65 | HR 85 | Temp 97.8°F | Ht 66.0 in | Wt 179.4 lb

## 2021-11-14 DIAGNOSIS — J029 Acute pharyngitis, unspecified: Secondary | ICD-10-CM | POA: Diagnosis not present

## 2021-11-14 LAB — CULTURE, GROUP A STREP

## 2021-11-14 LAB — RAPID STREP SCREEN (MED CTR MEBANE ONLY): Strep Gp A Ag, IA W/Reflex: NEGATIVE

## 2021-11-14 MED ORDER — AMOXICILLIN-POT CLAVULANATE 875-125 MG PO TABS: 1.0000 | ORAL_TABLET | Freq: Two times a day (BID) | ORAL | 0 refills | Status: DC

## 2021-11-14 NOTE — Progress Notes (Signed)
? ?Acute Office Visit ? ?Subjective:  ? ?  ?Patient ID: Joy Green, female    DOB: 06-14-2001, 21 y.o.   MRN: [id number] ? ?Chief Complaint  ?Patient presents with  ? Sore Throat  ?  All weekend , swollen tonsils - would like a strep test  ? upset stomach  ?  Since last night  ? ? ?Sore Throat  ?This is a new problem. Episode onset: in the past 2 days. The problem has been unchanged. There has been no fever. The pain is moderate. Associated symptoms include swollen glands. Pertinent negatives include no abdominal pain, congestion, coughing, hoarse voice, trouble swallowing or vomiting. She has had exposure to strep. She has tried nothing for the symptoms.  ? ? ?Review of Systems  ?Constitutional:  Negative for chills and fever.  ?HENT:  Positive for sore throat. Negative for congestion, hoarse voice and trouble swallowing.   ?Respiratory:  Negative for cough.   ?Cardiovascular: Negative.   ?Gastrointestinal:  Negative for abdominal pain and vomiting.  ?Skin: Negative.   ?All other systems reviewed and are negative. ? ? ?   ?Objective:  ?  ?BP 111/65   Pulse 85   Temp 97.8 ?F (36.6 ?C)   Ht 5\' 6"  (1.676 m)   Wt 179 lb 6.4 oz (81.4 kg)   SpO2 97%   BMI 28.96 kg/m?  ?BP Readings from Last 3 Encounters:  ?11/14/21 111/65  ?11/03/21 116/68  ?06/13/21 121/61  ? ?Wt Readings from Last 3 Encounters:  ?11/14/21 179 lb 6.4 oz (81.4 kg)  ?11/03/21 182 lb (82.6 kg)  ?06/13/21 193 lb (87.5 kg)  ? ?  ? ?Physical Exam ?Vitals and nursing note reviewed.  ?Constitutional:   ?   Appearance: Normal appearance.  ?HENT:  ?   Head: Normocephalic.  ?   Right Ear: Ear canal normal.  ?   Mouth/Throat:  ?   Mouth: Mucous membranes are moist.  ?   Pharynx: Uvula midline. Posterior oropharyngeal erythema present. No oropharyngeal exudate.  ?   Tonsils: No tonsillar exudate or tonsillar abscesses.  ?Eyes:  ?   Conjunctiva/sclera: Conjunctivae normal.  ?Cardiovascular:  ?   Rate and Rhythm: Normal rate and regular rhythm.  ?   Heart  sounds: Normal heart sounds.  ?Pulmonary:  ?   Effort: Pulmonary effort is normal.  ?   Breath sounds: Normal breath sounds.  ?Abdominal:  ?   General: Bowel sounds are normal.  ?   Palpations: Abdomen is soft.  ?Skin: ?   General: Skin is warm.  ?   Findings: No rash.  ?Neurological:  ?   Mental Status: She is alert.  ? ? ?No results found for any visits on 11/14/21. ? ? ?   ?Assessment & Plan:  ?Take meds as prescribed ?- Use a cool mist humidifier  ?-Use salt water gargle/chloraseptic spray as needed ?-Force fluids ?-For fever or aches or pains- take Tylenol or ibuprofen. ?-Augmentin 875-125 mg by mouth ?-Strep test completed results pending. ?-If symptoms do not improve, she may need to be COVID tested to rule this out ?Follow up with worsening unresolved symptoms  ?Problem List Items Addressed This Visit   ?None ?Visit Diagnoses   ? ? Sore throat    -  Primary  ? Relevant Medications  ? amoxicillin-clavulanate (AUGMENTIN) 875-125 MG tablet  ? Other Relevant Orders  ? Rapid Strep Screen ([facility] ONLY)  ? Pharyngitis, unspecified etiology      ? Relevant Medications  ?  amoxicillin-clavulanate (AUGMENTIN) 875-125 MG tablet  ? ?  ? ? ?Meds ordered this encounter  ?Medications  ? amoxicillin-clavulanate (AUGMENTIN) 875-125 MG tablet  ?  Sig: Take 1 tablet by mouth 2 (two) times daily.  ?  Dispense:  14 tablet  ?  Refill:  0  ?  Order Specific Question:   Supervising Provider  ?  AnswerMechele Claude [[id number]]  ? ? ?Return if symptoms worsen or fail to improve. ? ?Daryll Drown, NP ? ? ?

## 2021-11-14 NOTE — Patient Instructions (Signed)
Strep Throat, Adult Strep throat is an infection of the throat. It is caused by germs (bacteria). Strep throat is common during the cold months of the year. It mostly affects children who are 5-21 years old. However, people of all ages can get it at any time of the year. This infection spreads from person to person through coughing, sneezing, or having close contact. What are the causes? This condition is caused by the Streptococcus pyogenes germ. What increases the risk? You care for young children. Children are more likely to get strep throat and may spread it to others. You go to crowded places. Germs can spread easily in such places. You kiss or touch someone who has strep throat. What are the signs or symptoms? Fever or chills. Redness, swelling, or pain in the tonsils or throat. Pain or trouble when swallowing. White or yellow spots on the tonsils or throat. Tender glands in the neck and under the jaw. Bad breath. Red rash all over the body. This is rare. How is this treated? Medicines that kill germs (antibiotics). Medicines that treat pain or fever. These include: Ibuprofen or acetaminophen. Aspirin, only for people who are over the age of 18. Cough drops. Throat sprays. Follow these instructions at home: Medicines  Take over-the-counter and prescription medicines only as told by your doctor. Take your antibiotic medicine as told by your doctor. Do not stop taking the antibiotic even if you start to feel better. Eating and drinking  If you have trouble swallowing, eat soft foods until your throat feels better. Drink enough fluid to keep your pee (urine) pale yellow. To help with pain, you may have: Warm fluids, such as soup and tea. Cold fluids, such as frozen desserts or popsicles. General instructions Rinse your mouth (gargle) with a salt-water mixture 3-4 times a day or as needed. To make a salt-water mixture, dissolve -1 tsp (3-6 g) of salt in 1 cup (237 mL) of warm  water. Rest as much as you can. Stay home from work or school until you have been taking antibiotics for 24 hours. Do not smoke or use any products that contain nicotine or tobacco. If you need help quitting, ask your doctor. Keep all follow-up visits. How is this prevented?  Do not share food, drinking cups, or personal items. They can cause the germs to spread. Wash your hands well with soap and water. Make sure that all people in your house wash their hands well. Have family members tested if they have a fever or a sore throat. They may need an antibiotic if they have strep throat. Contact a doctor if: You have swelling in your neck that keeps getting bigger. You get a rash, cough, or earache. You cough up a thick fluid that is green, yellow-brown, or bloody. You have pain that does not get better with medicine. Your symptoms get worse instead of getting better. You have a fever. Get help right away if: You vomit. You have a very bad headache. Your neck hurts or feels stiff. You have chest pain or are short of breath. You have drooling, very bad throat pain, or changes in your voice. Your neck is swollen, or the skin gets red and tender. Your mouth is dry, or you are peeing less than normal. You keep feeling more tired or have trouble waking up. Your joints are red or painful. These symptoms may be an emergency. Do not wait to see if the symptoms will go away. Get help right away. Call   your local emergency services (911 in the [place] Summary Strep throat is an infection of the throat. It is caused by germs (bacteria). This infection can spread from person to person through coughing, sneezing, or having close contact. Take your medicines, including antibiotics, as told by your doctor. Do not stop taking the antibiotic even if you start to feel better. To prevent the spread of germs, wash your hands well with soap and water. Have others do the same. Do not share food, drinking cups,  or personal items. Get help right away if you have a bad headache, chest pain, shortness of breath, a stiff or painful neck, or you vomit. This information is not intended to replace advice given to you by your health care provider. Make sure you discuss any questions you have with your health care provider. Document Revised: 10/19/2020 Document Reviewed: 10/19/2020 Elsevier Patient Education  2023 [employer]vier Inc[employer]  

## 2021-11-15 ENCOUNTER — Encounter: Payer: Self-pay | Admitting: Emergency Medicine

## 2021-12-02 ENCOUNTER — Encounter: Payer: Self-pay | Admitting: Family Medicine

## 2021-12-02 ENCOUNTER — Ambulatory Visit: Payer: BC Managed Care – PPO | Admitting: Family Medicine

## 2021-12-02 VITALS — BP 112/59 | HR 77 | Temp 98.0°F | Ht 66.0 in | Wt 174.6 lb

## 2021-12-02 DIAGNOSIS — F339 Major depressive disorder, recurrent, unspecified: Secondary | ICD-10-CM | POA: Diagnosis not present

## 2021-12-02 DIAGNOSIS — F419 Anxiety disorder, unspecified: Secondary | ICD-10-CM

## 2021-12-02 NOTE — Progress Notes (Signed)
BP (!) 112/59   Pulse 77   Temp 98 F (36.7 C)   Ht 5\' 6"  (1.676 m)   Wt 174 lb 9.6 oz (79.2 kg)   SpO2 95%   BMI 28.18 kg/m    Subjective:   Patient ID: , female    DOB: 2000-12-26, 21 y.o.   MRN: [id number]  HPI: Joy Green is a 21 y.o. female presenting on 12/02/2021 for Medical Management of Chronic Issues   HPI Anxiety and depression Patient comes in for anxiety and depression recheck.  She says the Prozac is doing very well for her and she is very satisfied very happy and feels like things are going well and denies any panic episodes and denies any major feelings of anxiety or depression.  She is smiling today and she just feels like the medicine is very good for her.  She denies any major side effects except for that she is not sleeping as well but she will take some melatonin and see if that helps with    12/02/2021    4:08 PM 11/14/2021   10:55 AM 11/03/2021    3:01 PM 06/13/2021    1:48 PM 06/13/2021    1:46 PM  Depression screen PHQ 2/9  Decreased Interest 0 1 1  1   Down, Depressed, Hopeless 1 1 1 3 2   PHQ - 2 Score 1 2 2 3 3   Altered sleeping 2 0 2 3 1   Tired, decreased energy 2 0 1 1 3   Change in appetite 3 3 2 3 2   Feeling bad or failure about yourself  2 0 3 3 2   Trouble concentrating 1 1 3 1  0  Moving slowly or fidgety/restless 0 1 0 0 0  Suicidal thoughts 1 0 0 1 1  PHQ-9 Score 12 7 13 15 12   Difficult doing work/chores Not difficult at all Not difficult at all Somewhat difficult       Relevant past medical, surgical, family and social history reviewed and updated as indicated. Interim medical history since our last visit reviewed. Allergies and medications reviewed and updated.  Review of Systems  Constitutional:  Negative for chills and fever.  Eyes:  Negative for visual disturbance.  Respiratory:  Negative for chest tightness and shortness of breath.   Cardiovascular:  Negative for chest pain and leg swelling.  Skin:  Negative for  rash.  Neurological:  Negative for dizziness, light-headedness and headaches.  Psychiatric/Behavioral:  Negative for agitation, behavioral problems, dysphoric mood, self-injury and sleep disturbance. The patient is not nervous/anxious.   All other systems reviewed and are negative.  Per HPI unless specifically indicated above   Allergies as of 12/02/2021   No Known Allergies      Medication List        Accurate as of Dec 02, 2021  4:30 PM. If you have any questions, ask your nurse or doctor.          STOP taking these medications    amoxicillin-clavulanate 875-125 MG tablet Commonly known as: AUGMENTIN Stopped by: Javonna Balli, MD       TAKE these medications    FLUoxetine 20 MG capsule Commonly known as: PROZAC Take 1 capsule (20 mg total) by mouth at bedtime.   ibuprofen 200 MG tablet Commonly known as: ADVIL Take 400 mg by mouth as needed.         Objective:   BP (!) 112/59   Pulse 77   Temp 98 F (  36.7 C)   Ht 5\' 6"  (1.676 m)   Wt 174 lb 9.6 oz (79.2 kg)   SpO2 95%   BMI 28.18 kg/m   Wt Readings from Last 3 Encounters:  12/02/21 174 lb 9.6 oz (79.2 kg)  11/14/21 179 lb 6.4 oz (81.4 kg)  11/03/21 182 lb (82.6 kg)    Physical Exam Vitals and nursing note reviewed.  Constitutional:      General: She is not in acute distress.    Appearance: She is well-developed. She is not diaphoretic.  Eyes:     Conjunctiva/sclera: Conjunctivae normal.  Skin:    General: Skin is warm and dry.     Findings: No rash.  Neurological:     Mental Status: She is alert and oriented to person, place, and time.     Coordination: Coordination normal.  Psychiatric:        Behavior: Behavior normal.      Assessment & Plan:   Problem List Items Addressed This Visit   None Visit Diagnoses     Anxiety    -  Primary   Depression, recurrent (HCC)           Prozac seems to be doing well.  She does want her IUD look because it is causing some irritation  and discomfort for her partner during intercourse. Follow up plan: Return in about 3 months (around 03/04/2022), or if symptoms worsen or fail to improve, for Anxiety and depression recheck.  She can come back sooner if she wants to check up on her IUD.03/06/2022  Counseling provided for all of the vaccine components No orders of the defined types were placed in this encounter.   Marland Kitchen, MD [facility] 12/02/2021, 4:30 PM

## 2022-01-09 ENCOUNTER — Encounter: Payer: Self-pay | Admitting: Family Medicine

## 2022-01-09 ENCOUNTER — Ambulatory Visit: Payer: BC Managed Care – PPO | Admitting: Family Medicine

## 2022-02-27 ENCOUNTER — Encounter: Payer: BC Managed Care – PPO | Admitting: Family

## 2022-04-18 ENCOUNTER — Encounter: Payer: Self-pay | Admitting: Family Medicine

## 2022-04-18 ENCOUNTER — Telehealth (INDEPENDENT_AMBULATORY_CARE_PROVIDER_SITE_OTHER): Payer: BC Managed Care – PPO | Admitting: Family Medicine

## 2022-04-18 DIAGNOSIS — B9689 Other specified bacterial agents as the cause of diseases classified elsewhere: Secondary | ICD-10-CM | POA: Diagnosis not present

## 2022-04-18 DIAGNOSIS — J019 Acute sinusitis, unspecified: Secondary | ICD-10-CM

## 2022-04-18 MED ORDER — CEFDINIR 300 MG PO CAPS: 300.0000 mg | ORAL_CAPSULE | Freq: Two times a day (BID) | ORAL | 0 refills | Status: DC

## 2022-04-18 MED ORDER — BENZONATATE 100 MG PO CAPS: 100.0000 mg | ORAL_CAPSULE | Freq: Three times a day (TID) | ORAL | 0 refills | Status: DC | PRN

## 2022-04-18 MED ORDER — ALBUTEROL SULFATE HFA 108 (90 BASE) MCG/ACT IN AERS: 2.0000 | INHALATION_SPRAY | Freq: Four times a day (QID) | RESPIRATORY_TRACT | 0 refills | Status: DC | PRN

## 2022-04-18 NOTE — Patient Instructions (Signed)

## 2022-04-18 NOTE — Progress Notes (Signed)
MyChart Video visit  Subjective: CC:URI PCP: Dettinger, Fransisca Kaufmann, MD Joy Green is a 21 y.o. female. Patient provides verbal consent for consult held via video.  Due to COVID-19 pandemic this visit was conducted virtually. This visit type was conducted due to national recommendations for restrictions regarding the COVID-19 Pandemic (e.g. social distancing, sheltering in place) in an effort to limit this patient's exposure and mitigate transmission in our community. All issues noted in this document were discussed and addressed.  A physical exam was not performed with this format.   Location of patient: home Location of provider: [facility] Others present for call: none  1. URI Patient reports waking up this am feeling really sick.  She reports over the weekend she developed sinus congestion, drainage, sore throat. It got better but now it's back and worse.  She reports that she now feels like it's difficult to get a full breath in.  She used a puff of albuterol and that helped.  She reports productive cough with clear mucus.  No fevers.  Tmax 47F.  She works at a [employer], RSV and some type of viral illness going around.   ROS: Per HPI  No Known Allergies Past Medical History:  Diagnosis Date   Anxiety    Depression     Current Outpatient Medications:    FLUoxetine (PROZAC) 20 MG capsule, Take 1 capsule (20 mg total) by mouth at bedtime., Disp: 90 capsule, Rfl: 3   ibuprofen (ADVIL,MOTRIN) 200 MG tablet, Take 400 mg by mouth as needed., Disp: , Rfl:   Current Facility-Administered Medications:    levonorgestrel (MIRENA) 20 MCG/DAY IUD 1 each, 1 each, Intrauterine, Once, Dettinger, Fransisca Kaufmann, MD  Gen: Nontoxic female in no acute distress.  Appears tired Pulmonary: Sounds congested but no dyspnea with speech.  No wheezes appreciated  Assessment/ Plan: 21 y.o. female   Acute bacterial sinusitis - Plan: albuterol (VENTOLIN HFA) 108 (90 Base) MCG/ACT inhaler, cefdinir (OMNICEF) 300  MG capsule, benzonatate (TESSALON PERLES) 100 MG capsule  Going to cover empirically for acute bacterial sinusitis given "second sickening".  I have asked that she check for COVID-19 as well since this has been going around.  We discussed that in the event that this is in fact viral I do not expect her to respond to antibiotics and she should continue symptomatic care.  We discussed red flag signs symptoms warranting further evaluation.  Work note provided.  Follow-up as needed  Start time: 12:40p End time: 12:47p  Total time spent on patient care (including video visit/ documentation): 7 minutes  Joy Green, Joy Green [phone]

## 2022-05-10 ENCOUNTER — Other Ambulatory Visit: Payer: Self-pay | Admitting: Family Medicine

## 2022-05-10 DIAGNOSIS — B9689 Other specified bacterial agents as the cause of diseases classified elsewhere: Secondary | ICD-10-CM

## 2022-06-26 ENCOUNTER — Telehealth: Payer: Self-pay | Admitting: Family Medicine

## 2022-06-26 NOTE — Telephone Encounter (Signed)
No answer and vmail is full.  Pt can be worked in to check IUD. If time allows and its a 74m opening we can check IUD and pap. Otherwise only work in for IUD check and schedule CPE with pap separately.

## 2022-06-26 NOTE — Telephone Encounter (Signed)
Lmtcb.

## 2022-06-26 NOTE — Telephone Encounter (Signed)
Patient returning call. Please call back

## 2022-06-26 NOTE — Telephone Encounter (Signed)
Error' did not lmtcb- na mailbox full

## 2022-07-06 NOTE — Telephone Encounter (Signed)
Patient has appointment scheduled on 07/14/22 with Dr Louanne Skye

## 2022-07-14 ENCOUNTER — Other Ambulatory Visit (HOSPITAL_COMMUNITY)
Admission: RE | Admit: 2022-07-14 | Discharge: 2022-07-14 | Disposition: A | Discharge status: Home / Self Care | Payer: BC Managed Care – PPO | Source: Ambulatory Visit | Attending: Family Medicine | Admitting: Family Medicine

## 2022-07-14 ENCOUNTER — Ambulatory Visit (INDEPENDENT_AMBULATORY_CARE_PROVIDER_SITE_OTHER): Payer: BC Managed Care – PPO | Admitting: Family Medicine

## 2022-07-14 ENCOUNTER — Encounter: Payer: Self-pay | Admitting: Family Medicine

## 2022-07-14 VITALS — BP 98/56 | HR 88 | Temp 97.4°F | Ht 66.0 in | Wt 150.0 lb

## 2022-07-14 DIAGNOSIS — R7989 Other specified abnormal findings of blood chemistry: Secondary | ICD-10-CM | POA: Diagnosis not present

## 2022-07-14 DIAGNOSIS — Z23 Encounter for immunization: Secondary | ICD-10-CM | POA: Diagnosis not present

## 2022-07-14 DIAGNOSIS — Z01419 Encounter for gynecological examination (general) (routine) without abnormal findings: Secondary | ICD-10-CM | POA: Insufficient documentation

## 2022-07-14 DIAGNOSIS — Z113 Encounter for screening for infections with a predominantly sexual mode of transmission: Secondary | ICD-10-CM | POA: Diagnosis not present

## 2022-07-14 DIAGNOSIS — Z975 Presence of (intrauterine) contraceptive device: Secondary | ICD-10-CM

## 2022-07-14 DIAGNOSIS — Z01411 Encounter for gynecological examination (general) (routine) with abnormal findings: Secondary | ICD-10-CM

## 2022-07-14 NOTE — Addendum Note (Signed)
Addended by: Alphonzo Dublin on: 07/14/2022 02:02 PM   Modules accepted: Orders

## 2022-07-14 NOTE — Addendum Note (Signed)
Addended by: Brynda Peon F on: 07/14/2022 11:42 AM   Modules accepted: Orders

## 2022-07-14 NOTE — Progress Notes (Signed)
BP (!) 98/56   Pulse 88   Temp (!) 97.4 F (36.3 C)   Ht 5\' 6"  (1.676 m)   Wt 150 lb (68 kg)   LMP  (LMP Unknown)   SpO2 99%   BMI 24.21 kg/m    Subjective:   Patient ID: Joy Green, female    DOB: 03-10-2001, 22 y.o.   MRN: [id number]  HPI: Joy Green is a 22 y.o. female presenting on 07/14/2022 for Medical Management of Chronic Issues (Pap and breast exam), IUD placement check, and Facial Pain   HPI Well woman exam and IUD check Patient denies any chest pain, shortness of breath, headaches or vision issues, abdominal complaints, diarrhea, nausea, vomiting, or joint issues.  She was concerned because her boyfriend said something about feeling the IUD shift and she actually took a Plan B last month just to be sure.  She does have cycles every month still but they are less although she does get a few days of spotting before the cycle and then she will have a cycle.  She has been losing weight and feels like she is doing in a very healthy sustainable weight.  She denies any vaginal discharge or vaginal issues.  She denies any breast issues.  Relevant past medical, surgical, family and social history reviewed and updated as indicated. Interim medical history since our last visit reviewed. Allergies and medications reviewed and updated.  Review of Systems  Constitutional:  Negative for chills and fever.  HENT:  Negative for congestion, ear discharge, ear pain and tinnitus.   Eyes:  Negative for pain, redness and visual disturbance.  Respiratory:  Negative for cough, chest tightness, shortness of breath and wheezing.   Cardiovascular:  Negative for chest pain, palpitations and leg swelling.  Gastrointestinal:  Negative for abdominal pain, blood in stool, constipation and diarrhea.  Genitourinary:  Negative for difficulty urinating, dysuria and hematuria.  Musculoskeletal:  Negative for back pain, gait problem and myalgias.  Skin:  Negative for rash.  Neurological:  Negative for  dizziness, weakness, light-headedness and headaches.  Psychiatric/Behavioral:  Negative for agitation, behavioral problems and suicidal ideas.   All other systems reviewed and are negative.   Per HPI unless specifically indicated above   Allergies as of 07/14/2022   No Known Allergies      Medication List        Accurate as of July 14, 2022 11:30 AM. If you have any questions, ask your nurse or doctor.          STOP taking these medications    benzonatate 100 MG capsule Commonly known as: Best boy Stopped by: Fransisca Kaufmann Nikkia Devoss, MD   cefdinir 300 MG capsule Commonly known as: OMNICEF Stopped by: Fransisca Kaufmann Zaire Vanbuskirk, MD   FLUoxetine 20 MG capsule Commonly known as: PROZAC Stopped by: Fransisca Kaufmann Malyiah Fellows, MD       TAKE these medications    albuterol 108 (90 Base) MCG/ACT inhaler Commonly known as: VENTOLIN HFA INHALE 2 PUFFS INTO THE LUNGS EVERY 6 HOURS AS NEEDED FOR WHEEZE OR SHORTNESS OF BREATH   ibuprofen 200 MG tablet Commonly known as: ADVIL Take 400 mg by mouth as needed.         Objective:   BP (!) 98/56   Pulse 88   Temp (!) 97.4 F (36.3 C)   Ht 5\' 6"  (1.676 m)   Wt 150 lb (68 kg)   LMP  (LMP Unknown)   SpO2 99%   BMI  24.21 kg/m   Wt Readings from Last 3 Encounters:  07/14/22 150 lb (68 kg)  12/02/21 174 lb 9.6 oz (79.2 kg)  11/14/21 179 lb 6.4 oz (81.4 kg)    Physical Exam Vitals reviewed.  Constitutional:      General: She is not in acute distress.    Appearance: She is well-developed. She is not diaphoretic.  Eyes:     Conjunctiva/sclera: Conjunctivae normal.  Neck:     Thyroid: No thyromegaly.  Cardiovascular:     Rate and Rhythm: Normal rate and regular rhythm.     Heart sounds: Normal heart sounds. No murmur heard. Pulmonary:     Effort: Pulmonary effort is normal. No respiratory distress.     Breath sounds: Normal breath sounds. No wheezing.  Chest:  Breasts:    Breasts are symmetrical.     Right: No inverted  nipple, mass, nipple discharge, skin change or tenderness.     Left: No inverted nipple, mass, nipple discharge, skin change or tenderness.  Abdominal:     General: Bowel sounds are normal. There is no distension.     Palpations: Abdomen is soft.     Tenderness: There is no abdominal tenderness. There is no guarding or rebound.  Genitourinary:    Exam position: Supine.     Labia:        Right: No rash or lesion.        Left: No rash or lesion.      Vagina: Normal.     Cervix: No cervical motion tenderness, discharge or friability.     Uterus: Not deviated, not enlarged, not fixed and not tender.      Adnexa:        Right: No mass or tenderness.         Left: No mass or tenderness.    Musculoskeletal:        General: No swelling. Normal range of motion.     Cervical back: Neck supple.  Lymphadenopathy:     Cervical: No cervical adenopathy.  Skin:    General: Skin is warm and dry.     Findings: No rash.  Neurological:     Mental Status: She is alert and oriented to person, place, and time.     Coordination: Coordination normal.  Psychiatric:        Behavior: Behavior normal.       Assessment & Plan:   Problem List Items Addressed This Visit       Other   Elevated TSH   Relevant Orders   Thyroid Panel With TSH   Other Visit Diagnoses     Well woman exam    -  Primary   Relevant Orders   Cytology - PAP([place])   HSV(herpes simplex vrs) 1+2 ab-IgG   RPR   HepB+HepC+HIV Panel   IUD (intrauterine device) in place           Will do blood work and STD testing and Pap smear.  She had ASCUS on her last Pap smear. Follow up plan: Return in about 1 year (around 07/15/2023), or if symptoms worsen or fail to improve, for Physical and Pap smear.  Counseling provided for all of the vaccine components Orders Placed This Encounter  Procedures   HSV(herpes simplex vrs) 1+2 ab-IgG   RPR   HepB+HepC+HIV Panel   Thyroid Panel With TSH    Caryl Pina, MD [facility] 07/14/2022, 11:30 AM

## 2022-07-18 LAB — HEPB+HEPC+HIV PANEL
HIV Screen 4th Generation wRfx: NONREACTIVE
Hep B C IgM: NEGATIVE
Hep B Core Total Ab: NEGATIVE
Hep B E Ab: NEGATIVE
Hep B E Ag: NEGATIVE
Hep B Surface Ab, Qual: NONREACTIVE
Hep C Virus Ab: NONREACTIVE
Hepatitis B Surface Ag: NEGATIVE

## 2022-07-18 LAB — CYTOLOGY - PAP
Chlamydia: NEGATIVE
Comment: NEGATIVE
Comment: NORMAL
Diagnosis: NEGATIVE
Diagnosis: REACTIVE
Neisseria Gonorrhea: NEGATIVE

## 2022-07-18 LAB — THYROID PANEL WITH TSH
Free Thyroxine Index: 1.7 (ref 1.2–4.9)
T3 Uptake Ratio: 27 % (ref 24–39)
T4, Total: 6.3 ug/dL (ref 4.5–12.0)
TSH: 1.38 u[IU]/mL (ref 0.450–4.500)

## 2022-07-18 LAB — HSV 1 AND 2 AB, IGG
HSV 1 Glycoprotein G Ab, IgG: <0.91 index (ref 0.00–0.90)
HSV 2 IgG, Type Spec: <0.91 index (ref 0.00–0.90)

## 2022-07-18 LAB — RPR: RPR Ser Ql: NONREACTIVE

## 2022-07-19 ENCOUNTER — Other Ambulatory Visit: Payer: Self-pay | Admitting: Family Medicine

## 2022-09-12 ENCOUNTER — Ambulatory Visit (INDEPENDENT_AMBULATORY_CARE_PROVIDER_SITE_OTHER): Payer: BC Managed Care – PPO

## 2022-09-12 DIAGNOSIS — Z23 Encounter for immunization: Secondary | ICD-10-CM

## 2022-09-16 ENCOUNTER — Other Ambulatory Visit: Payer: Self-pay | Admitting: Family Medicine

## 2022-09-16 DIAGNOSIS — B9689 Other specified bacterial agents as the cause of diseases classified elsewhere: Secondary | ICD-10-CM

## 2022-11-13 ENCOUNTER — Ambulatory Visit: Payer: BC Managed Care – PPO | Admitting: Family Medicine

## 2022-11-13 ENCOUNTER — Encounter: Payer: Self-pay | Admitting: Family Medicine

## 2022-11-13 VITALS — BP 108/70 | HR 63 | Ht 66.0 in | Wt 148.0 lb

## 2022-11-13 DIAGNOSIS — F419 Anxiety disorder, unspecified: Secondary | ICD-10-CM | POA: Diagnosis not present

## 2022-11-13 DIAGNOSIS — F32A Depression, unspecified: Secondary | ICD-10-CM | POA: Diagnosis not present

## 2022-11-13 MED ORDER — DESVENLAFAXINE SUCCINATE ER 50 MG PO TB24: 50.0000 mg | ORAL_TABLET | Freq: Every day | ORAL | 1 refills | Status: DC

## 2022-11-13 NOTE — Progress Notes (Signed)
BP 108/70   Pulse 63   Ht 5\' 6"  (1.676 m)   Wt 148 lb (67.1 kg)   SpO2 98%   BMI 23.89 kg/m    Subjective:   Patient ID: Joy Green, female    DOB: [birth year], 22 y.o.   MRN: [id number]  HPI: Joy Green is a 22 y.o. female presenting on 11/13/2022 for Medical Management of Chronic Issues, Anxiety, and Depression   HPI Anxiety and depression Patient is coming in today for anxiety and depression.  She says not doing well.  Patient says she has been struggling a lot recently over the past few months.  She just feels like she is anxious these episodes where she disconnects from the world but she knows she still there but she feels disconnected.  She does have being better off dead but no suicidal ideations.  She feels like she is just so overwhelmed and very tearful not sleeping and energy.  She just feels like things are so much worse and she feels like all the people around her and judging her.  She knows that she is self judging and has low self-confidence.    11/13/2022    8:37 AM 07/14/2022   11:37 AM 12/02/2021    4:08 PM 11/14/2021   10:55 AM 11/03/2021    3:01 PM  Depression screen PHQ 2/9  Decreased Interest 0 0 0 1 1  Down, Depressed, Hopeless 3 1 1 1 1   PHQ - 2 Score 3 1 1 2 2   Altered sleeping 0 1 2 0 2  Tired, decreased energy 0 1 2 0 1  Change in appetite 1 0 3 3 2   Feeling bad or failure about yourself  3 0 2 0 3  Trouble concentrating 2 1 1 1 3   Moving slowly or fidgety/restless 2 0 0 1 0  Suicidal thoughts 1 1 1  0 0  PHQ-9 Score 12 5 12 7 13   Difficult doing work/chores Somewhat difficult Not difficult at all Not difficult at all Not difficult at all Somewhat difficult     Relevant past medical, surgical, family and social history reviewed and updated as indicated. Interim medical history since our last visit reviewed. Allergies and medications reviewed and updated.  Review of Systems  Constitutional:  Negative for chills and fever.  HENT:  Negative for  congestion, ear discharge and ear pain.   Eyes:  Negative for redness and visual disturbance.  Respiratory:  Negative for chest tightness and shortness of breath.   Cardiovascular:  Negative for chest pain and leg swelling.  Genitourinary:  Negative for difficulty urinating and dysuria.  Musculoskeletal:  Negative for back pain and gait problem.  Skin:  Negative for rash.  Neurological:  Negative for light-headedness and headaches.  Psychiatric/Behavioral:  Positive for dysphoric mood. Negative for agitation, behavioral problems, self-injury, sleep disturbance and suicidal ideas. The patient is nervous/anxious.   All other systems reviewed and are negative.   Per HPI unless specifically indicated above   Allergies as of 11/13/2022   No Known Allergies      Medication List        Accurate as of Nov 13, 2022  9:15 AM. If you have any questions, ask your nurse or doctor.          albuterol 108 (90 Base) MCG/ACT inhaler Commonly known as: VENTOLIN HFA INHALE 2 PUFFS INTO THE LUNGS EVERY 6 HOURS AS NEEDED FOR WHEEZE OR SHORTNESS OF BREATH   desvenlafaxine 50 MG  24 hr tablet Commonly known as: Pristiq Take 1 tablet (50 mg total) by mouth daily. Started by: Elige Radon Meckenzie Balsley, MD   ibuprofen 200 MG tablet Commonly known as: ADVIL Take 400 mg by mouth as needed.         Objective:   BP 108/70   Pulse 63   Ht 5\' 6"  (1.676 m)   Wt 148 lb (67.1 kg)   SpO2 98%   BMI 23.89 kg/m   Wt Readings from Last 3 Encounters:  11/13/22 148 lb (67.1 kg)  07/14/22 150 lb (68 kg)  12/02/21 174 lb 9.6 oz (79.2 kg)    Physical Exam Vitals and nursing note reviewed.  Constitutional:      General: She is not in acute distress.    Appearance: She is well-developed. She is not diaphoretic.  Eyes:     Conjunctiva/sclera: Conjunctivae normal.  Cardiovascular:     Rate and Rhythm: Normal rate and regular rhythm.     Heart sounds: Normal heart sounds. No murmur heard. Pulmonary:      Effort: Pulmonary effort is normal. No respiratory distress.     Breath sounds: Normal breath sounds. No wheezing.  Musculoskeletal:        General: No swelling or tenderness. Normal range of motion.  Skin:    General: Skin is warm and dry.     Findings: No rash.  Neurological:     Mental Status: She is alert and oriented to person, place, and time.     Coordination: Coordination normal.  Psychiatric:        Behavior: Behavior normal.       Assessment & Plan:   Problem List Items Addressed This Visit   None Visit Diagnoses     Anxiety and depression    -  Primary   Relevant Medications   desvenlafaxine (PRISTIQ) 50 MG 24 hr tablet   Other Relevant Orders   CBC with Differential/Platelet   CMP14+EGFR   TSH       We will start Pristiq and see how she does with that.  She will call us if she has any side effects or any suicidal ideations. Follow up plan: Return if symptoms worsen or fail to improve, for 3 to 4-week anxiety depression.  Counseling provided for all of the vaccine components Orders Placed This Encounter  Procedures   CBC with Differential/Platelet   CMP14+EGFR   TSH    Arville Care, MD [facility] 11/13/2022, 9:15 AM

## 2022-12-05 ENCOUNTER — Other Ambulatory Visit: Payer: Self-pay | Admitting: Family Medicine

## 2022-12-05 DIAGNOSIS — F419 Anxiety disorder, unspecified: Secondary | ICD-10-CM

## 2022-12-11 ENCOUNTER — Encounter: Payer: Self-pay | Admitting: Family Medicine

## 2022-12-11 ENCOUNTER — Ambulatory Visit: Payer: BC Managed Care – PPO | Admitting: Family Medicine

## 2022-12-11 VITALS — BP 100/54 | HR 56 | Ht 66.0 in | Wt 146.0 lb

## 2022-12-11 DIAGNOSIS — F32A Depression, unspecified: Secondary | ICD-10-CM

## 2022-12-11 DIAGNOSIS — F419 Anxiety disorder, unspecified: Secondary | ICD-10-CM | POA: Diagnosis not present

## 2022-12-11 NOTE — Progress Notes (Signed)
BP (!) 100/54   Pulse (!) 56   Ht 5\' 6"  (1.676 m)   Wt 146 lb (66.2 kg)   SpO2 99%   BMI 23.57 kg/m    Subjective:   Patient ID: Joy Green, female    DOB: 09-09-2000, 22 y.o.   MRN: [id number]  HPI: Joy Green is a 22 y.o. female presenting on 12/11/2022 for Medical Management of Chronic Issues and Anxiety (Doing well with Pristiq per pt)   HPI Anxiety depression recheck Patient is coming in today for anxiety depression recheck.  She has been on the Pristiq for a little more than 3 weeks and she feels like it is helping and she feels like her anxiety is much better and she has had some days without any anxiety.  She says she still does have passing thoughts of hurting herself but they go away quickly and no real suicidal ideations for her.  She says that she does not feel as much disconnected and in general is feeling a lot better.  Relevant past medical, surgical, family and social history reviewed and updated as indicated. Interim medical history since our last visit reviewed. Allergies and medications reviewed and updated.  Review of Systems  Constitutional:  Negative for chills and fever.  Eyes:  Negative for visual disturbance.  Respiratory:  Negative for chest tightness and shortness of breath.   Cardiovascular:  Negative for chest pain and leg swelling.  Skin:  Negative for rash.  Neurological:  Negative for dizziness, light-headedness and headaches.  Psychiatric/Behavioral:  Positive for dysphoric mood. Negative for agitation, behavioral problems, self-injury, sleep disturbance and suicidal ideas. The patient is nervous/anxious.   All other systems reviewed and are negative.   Per HPI unless specifically indicated above   Allergies as of 12/11/2022   No Known Allergies      Medication List        Accurate as of December 11, 2022 10:20 AM. If you have any questions, ask your nurse or doctor.          albuterol 108 (90 Base) MCG/ACT inhaler Commonly known  as: VENTOLIN HFA INHALE 2 PUFFS INTO THE LUNGS EVERY 6 HOURS AS NEEDED FOR WHEEZE OR SHORTNESS OF BREATH   desvenlafaxine 50 MG 24 hr tablet Commonly known as: PRISTIQ TAKE 1 TABLET BY MOUTH EVERY DAY   ibuprofen 200 MG tablet Commonly known as: ADVIL Take 400 mg by mouth as needed.         Objective:   BP (!) 100/54   Pulse (!) 56   Ht 5\' 6"  (1.676 m)   Wt 146 lb (66.2 kg)   SpO2 99%   BMI 23.57 kg/m   Wt Readings from Last 3 Encounters:  12/11/22 146 lb (66.2 kg)  11/13/22 148 lb (67.1 kg)  07/14/22 150 lb (68 kg)    Physical Exam Vitals and nursing note reviewed.  Constitutional:      General: She is not in acute distress.    Appearance: She is well-developed. She is not diaphoretic.  Eyes:     Conjunctiva/sclera: Conjunctivae normal.  Cardiovascular:     Rate and Rhythm: Normal rate and regular rhythm.     Heart sounds: Normal heart sounds. No murmur heard. Pulmonary:     Effort: Pulmonary effort is normal. No respiratory distress.     Breath sounds: Normal breath sounds. No wheezing.  Musculoskeletal:        General: No tenderness. Normal range of motion.  Skin:  General: Skin is warm and dry.     Findings: No rash.  Neurological:     Mental Status: She is alert and oriented to person, place, and time.     Coordination: Coordination normal.  Psychiatric:        Mood and Affect: Mood is anxious and depressed.        Behavior: Behavior normal.        Thought Content: Thought content does not include suicidal ideation. Thought content does not include suicidal plan.       Assessment & Plan:   Problem List Items Addressed This Visit       Other   Anxiety and depression - Primary    Continue current medicine, she is doing well on the Pristiq.  She is going to see counseling on the 11th, 8 days from now.  She says she did have some decreased appetite initially when she started medicine but that is calmed down some.  She also had some nausea  initially. Follow up plan: Return in about 4 weeks (around 01/08/2023), or if symptoms worsen or fail to improve, for anxiety and depression.  Counseling provided for all of the vaccine components No orders of the defined types were placed in this encounter.   Arville Care, MD [facility] 12/11/2022, 10:20 AM

## 2022-12-19 DIAGNOSIS — F332 Major depressive disorder, recurrent severe without psychotic features: Secondary | ICD-10-CM | POA: Diagnosis not present

## 2022-12-26 DIAGNOSIS — F333 Major depressive disorder, recurrent, severe with psychotic symptoms: Secondary | ICD-10-CM | POA: Diagnosis not present

## 2023-01-08 ENCOUNTER — Other Ambulatory Visit: Payer: Self-pay | Admitting: Family Medicine

## 2023-01-08 DIAGNOSIS — B9689 Other specified bacterial agents as the cause of diseases classified elsewhere: Secondary | ICD-10-CM

## 2023-01-12 ENCOUNTER — Ambulatory Visit (INDEPENDENT_AMBULATORY_CARE_PROVIDER_SITE_OTHER): Payer: BC Managed Care – PPO | Admitting: Family Medicine

## 2023-01-12 ENCOUNTER — Ambulatory Visit: Payer: BC Managed Care – PPO

## 2023-01-12 ENCOUNTER — Encounter: Payer: Self-pay | Admitting: Family Medicine

## 2023-01-12 VITALS — BP 100/60 | HR 70 | Ht 66.0 in | Wt 139.0 lb

## 2023-01-12 DIAGNOSIS — F419 Anxiety disorder, unspecified: Secondary | ICD-10-CM

## 2023-01-12 DIAGNOSIS — Z23 Encounter for immunization: Secondary | ICD-10-CM

## 2023-01-12 DIAGNOSIS — F32A Depression, unspecified: Secondary | ICD-10-CM | POA: Diagnosis not present

## 2023-01-12 LAB — CMP14+EGFR
ALT: 9 IU/L (ref 0–32)
BUN/Creatinine Ratio: 15 (ref 9–23)
CO2: 21 mmol/L (ref 20–29)
Chloride: 105 mmol/L (ref 96–106)
Sodium: 137 mmol/L (ref 134–144)
eGFR: 127 mL/min/{1.73_m2} (ref >59)

## 2023-01-12 LAB — ANEMIA PROFILE B
Basos: 1 %
Hemoglobin: 13.1 g/dL (ref 11.1–15.9)
Immature Grans (Abs): 0 10*3/uL (ref 0.0–0.1)
Lymphocytes Absolute: 1.7 10*3/uL (ref 0.7–3.1)
Lymphs: 25 %
MCHC: 34.1 g/dL (ref 31.5–35.7)
Monocytes Absolute: 0.6 10*3/uL (ref 0.1–0.9)
Platelets: 282 10*3/uL (ref 150–450)
RDW: 12.1 % (ref 11.7–15.4)

## 2023-01-12 NOTE — Progress Notes (Signed)
BP 100/60   Pulse 70   Ht 5\' 6"  (1.676 m)   Wt 139 lb (63 kg)   SpO2 99%   BMI 22.44 kg/m    Subjective:   Patient ID: Joy Green, female    DOB: 2000/09/20, 22 y.o.   MRN: [id number]  HPI: Joy Green is a 22 y.o. female presenting on 01/12/2023 for Medical Management of Chronic Issues, Depression, and Anxiety   HPI Anxiety depression recheck. Patient is coming in today for anxiety depression recheck.  She is currently on Pristiq 50 mg daily.  She feels like she is doing a lot better on Pristiq.  She has started seeing counseling recommended to a psychiatrist that she is going to see if she is very happy about that progression and they will start managing things.  She seems to be in a good place right now and works going well and she wants to continue forward with that.  Denies any suicidal ideations or thoughts.  Still    01/12/2023    9:25 AM 12/11/2022   10:20 AM 11/13/2022    8:37 AM 07/14/2022   11:37 AM 12/02/2021    4:08 PM  Depression screen PHQ 2/9  Decreased Interest 0 1 0 0 0  Down, Depressed, Hopeless 0 1 3 1 1   PHQ - 2 Score 0 2 3 1 1   Altered sleeping 1 1 0 1 2  Tired, decreased energy 2 0 0 1 2  Change in appetite 3 3 1  0 3  Feeling bad or failure about yourself  0 2 3 0 2  Trouble concentrating 2 1 2 1 1   Moving slowly or fidgety/restless 0 0 2 0 0  Suicidal thoughts 0 2 1 1 1   PHQ-9 Score 8 11 12 5 12   Difficult doing work/chores Somewhat difficult Somewhat difficult Somewhat difficult Not difficult at all Not difficult at all    Her only complaint is that she does have some sinus congestion but is little lightheaded especially when she bends forward and then sits up.  Her blood pressure is on the lower side today.  Relevant past medical, surgical, family and social history reviewed and updated as indicated. Interim medical history since our last visit reviewed. Allergies and medications reviewed and updated.  Review of Systems  Constitutional:  Negative  for chills and fever.  HENT:  Positive for congestion, postnasal drip and sinus pressure. Negative for ear discharge and ear pain.   Eyes:  Negative for redness and visual disturbance.  Respiratory:  Negative for chest tightness and shortness of breath.   Cardiovascular:  Negative for chest pain and leg swelling.  Genitourinary:  Negative for difficulty urinating and dysuria.  Musculoskeletal:  Negative for back pain and gait problem.  Skin:  Negative for rash.  Neurological:  Negative for light-headedness and headaches.  Psychiatric/Behavioral:  Negative for agitation and behavioral problems.   All other systems reviewed and are negative.   Per HPI unless specifically indicated above   Allergies as of 01/12/2023   No Known Allergies      Medication List        Accurate as of January 12, 2023  9:46 AM. If you have any questions, ask your nurse or doctor.          albuterol 108 (90 Base) MCG/ACT inhaler Commonly known as: VENTOLIN HFA INHALE 2 PUFFS INTO THE LUNGS EVERY 6 HOURS AS NEEDED FOR WHEEZE OR SHORTNESS OF BREATH   desvenlafaxine 50 MG  24 hr tablet Commonly known as: PRISTIQ TAKE 1 TABLET BY MOUTH EVERY DAY   ibuprofen 200 MG tablet Commonly known as: ADVIL Take 400 mg by mouth as needed.         Objective:   BP 100/60   Pulse 70   Ht 5\' 6"  (1.676 m)   Wt 139 lb (63 kg)   SpO2 99%   BMI 22.44 kg/m   Wt Readings from Last 3 Encounters:  01/12/23 139 lb (63 kg)  12/11/22 146 lb (66.2 kg)  11/13/22 148 lb (67.1 kg)    Physical Exam Vitals and nursing note reviewed.  Constitutional:      General: She is not in acute distress.    Appearance: She is well-developed. She is not diaphoretic.  Eyes:     Conjunctiva/sclera: Conjunctivae normal.  Cardiovascular:     Rate and Rhythm: Normal rate and regular rhythm.     Heart sounds: Normal heart sounds. No murmur heard. Pulmonary:     Effort: Pulmonary effort is normal. No respiratory distress.     Breath  sounds: Normal breath sounds. No wheezing.  Musculoskeletal:        General: No swelling or tenderness. Normal range of motion.  Skin:    General: Skin is warm and dry.     Findings: No rash.  Neurological:     Mental Status: She is alert and oriented to person, place, and time.     Coordination: Coordination normal.  Psychiatric:        Behavior: Behavior normal.       Assessment & Plan:   Problem List Items Addressed This Visit       Other   Anxiety and depression - Primary   Relevant Orders   Anemia Profile B   CMP14+EGFR   TSH   Other Visit Diagnoses     Need for HPV vaccination       Relevant Orders   HPV 9-valent vaccine,Recombinat (Completed)       Will check blood work today, continue with the Pristiq. Follow up plan: Return in about 6 months (around 07/15/2023), or if symptoms worsen or fail to improve, for Physical exam and well woman exam.  Counseling provided for all of the vaccine components Orders Placed This Encounter  Procedures   HPV 9-valent vaccine,Recombinat   Anemia Profile B   CMP14+EGFR   TSH    Arville Care, MD [place] [facility] 01/12/2023, 9:46 AM

## 2023-01-13 LAB — ANEMIA PROFILE B
Basophils Absolute: 0.1 10*3/uL (ref 0.0–0.2)
EOS (ABSOLUTE): 0.1 10*3/uL (ref 0.0–0.4)
Eos: 2 %
Ferritin: 56 ng/mL (ref 15–150)
Hematocrit: 38.4 % (ref 34.0–46.6)
Immature Granulocytes: 0 %
Iron Saturation: 23 % (ref 15–55)
MCH: 31.1 pg (ref 26.6–33.0)
MCV: 91 fL (ref 79–97)
Monocytes: 9 %
Neutrophils Absolute: 4.4 10*3/uL (ref 1.4–7.0)
Neutrophils: 63 %
RBC: 4.21 x10E6/uL (ref 3.77–5.28)
Retic Ct Pct: 0.9 % (ref 0.6–2.6)
UIBC: 206 ug/dL (ref 131–425)
Vitamin B-12: 363 pg/mL (ref 232–1245)
WBC: 7 10*3/uL (ref 3.4–10.8)

## 2023-01-13 LAB — CMP14+EGFR
AST: 10 IU/L (ref 0–40)
Albumin: 4.1 g/dL (ref 4.0–5.0)
Alkaline Phosphatase: 76 IU/L (ref 44–121)
BUN: 10 mg/dL (ref 6–20)
Bilirubin Total: 0.4 mg/dL (ref 0.0–1.2)
Calcium: 9 mg/dL (ref 8.7–10.2)
Creatinine, Ser: 0.66 mg/dL (ref 0.57–1.00)
Globulin, Total: 2.4 g/dL (ref 1.5–4.5)
Glucose: 90 mg/dL (ref 70–99)
Potassium: 4.1 mmol/L (ref 3.5–5.2)
Total Protein: 6.5 g/dL (ref 6.0–8.5)

## 2023-01-13 LAB — TSH: TSH: 2.35 u[IU]/mL (ref 0.450–4.500)

## 2023-01-30 DIAGNOSIS — F333 Major depressive disorder, recurrent, severe with psychotic symptoms: Secondary | ICD-10-CM | POA: Diagnosis not present

## 2023-02-01 DIAGNOSIS — F331 Major depressive disorder, recurrent, moderate: Secondary | ICD-10-CM | POA: Diagnosis not present

## 2023-02-01 DIAGNOSIS — Z5181 Encounter for therapeutic drug level monitoring: Secondary | ICD-10-CM | POA: Diagnosis not present

## 2023-02-08 ENCOUNTER — Other Ambulatory Visit: Payer: Self-pay | Admitting: Family Medicine

## 2023-02-08 DIAGNOSIS — B9689 Other specified bacterial agents as the cause of diseases classified elsewhere: Secondary | ICD-10-CM

## 2023-02-22 DIAGNOSIS — F331 Major depressive disorder, recurrent, moderate: Secondary | ICD-10-CM | POA: Diagnosis not present

## 2023-03-21 DIAGNOSIS — F331 Major depressive disorder, recurrent, moderate: Secondary | ICD-10-CM | POA: Diagnosis not present

## 2023-03-21 DIAGNOSIS — F39 Unspecified mood [affective] disorder: Secondary | ICD-10-CM | POA: Diagnosis not present

## 2023-03-22 DIAGNOSIS — F331 Major depressive disorder, recurrent, moderate: Secondary | ICD-10-CM | POA: Diagnosis not present

## 2023-03-22 DIAGNOSIS — F39 Unspecified mood [affective] disorder: Secondary | ICD-10-CM | POA: Diagnosis not present

## 2023-03-29 DIAGNOSIS — F39 Unspecified mood [affective] disorder: Secondary | ICD-10-CM | POA: Diagnosis not present

## 2023-03-29 DIAGNOSIS — F331 Major depressive disorder, recurrent, moderate: Secondary | ICD-10-CM | POA: Diagnosis not present

## 2023-04-17 DIAGNOSIS — F39 Unspecified mood [affective] disorder: Secondary | ICD-10-CM | POA: Diagnosis not present

## 2023-04-17 DIAGNOSIS — F331 Major depressive disorder, recurrent, moderate: Secondary | ICD-10-CM | POA: Diagnosis not present

## 2023-04-30 ENCOUNTER — Encounter: Payer: Self-pay | Admitting: Family

## 2023-04-30 ENCOUNTER — Telehealth (INDEPENDENT_AMBULATORY_CARE_PROVIDER_SITE_OTHER): Payer: BC Managed Care – PPO | Admitting: Family

## 2023-04-30 DIAGNOSIS — J069 Acute upper respiratory infection, unspecified: Secondary | ICD-10-CM

## 2023-04-30 MED ORDER — FLUTICASONE PROPIONATE 50 MCG/ACT NA SUSP
2.0000 | Freq: Every day | NASAL | 6 refills | Status: DC
Start: 2023-04-30 — End: 2023-12-14

## 2023-04-30 MED ORDER — BENZONATATE 200 MG PO CAPS
200.0000 mg | ORAL_CAPSULE | Freq: Three times a day (TID) | ORAL | 1 refills | Status: DC | PRN
Start: 2023-04-30 — End: 2023-12-14

## 2023-04-30 MED ORDER — CETIRIZINE HCL 10 MG PO TABS
10.0000 mg | ORAL_TABLET | Freq: Every day | ORAL | 1 refills | Status: DC
Start: 2023-04-30 — End: 2023-10-25

## 2023-04-30 NOTE — Patient Instructions (Signed)

## 2023-04-30 NOTE — Progress Notes (Signed)
Virtual Visit Consent   Joy Green, you are scheduled for a virtual visit with a [facility] provider today. Just as with appointments in the office, your consent must be obtained to participate. Your consent will be active for this visit and any virtual visit you may have with one of our providers in the next 365 days. If you have a MyChart account, a copy of this consent can be sent to you electronically.  As this is a virtual visit, video technology does not allow for your provider to perform a traditional examination. This may limit your provider's ability to fully assess your condition. If your provider identifies any concerns that need to be evaluated in person or the need to arrange testing (such as labs, EKG, etc.), we will make arrangements to do so. Although advances in technology are sophisticated, we cannot ensure that it will always work on either your end or our end. If the connection with a video visit is poor, the visit may have to be switched to a telephone visit. With either a video or telephone visit, we are not always able to ensure that we have a secure connection.  By engaging in this virtual visit, you consent to the provision of healthcare and authorize for your insurance to be billed (if applicable) for the services provided during this visit. Depending on your insurance coverage, you may receive a charge related to this service.  I need to obtain your verbal consent now. Are you willing to proceed with your visit today? Joy Green has provided verbal consent on 04/30/2023 for a virtual visit (video or telephone). Joy Rodney, FNP  Date: 04/30/2023 3:10 PM  Virtual Visit via Video Note   I, Joy Green, connected with  Joy Green  ([id number], 07-May-2001) on 04/30/23 at  6:15 PM EDT by a video-enabled telemedicine application and verified that I am speaking with the correct person using two identifiers.  Location: Patient: Virtual Visit Location Patient:  Home Provider: Virtual Visit Location Provider: Office   I discussed the limitations of evaluation and management by telemedicine and the availability of in person appointments. The patient expressed understanding and agreed to proceed.    History of Present Illness: Joy Green is a 22 y.o. who identifies as a female who was assigned female at birth, and is being seen today for cough and fever that started two days .  HPI: Cough This is a new problem. The current episode started in the past 7 days. The problem has been unchanged. The problem occurs every few minutes. The cough is Productive of purulent sputum and productive of sputum. Associated symptoms include chills, a fever (101.8 F), headaches, myalgias and a sore throat. Pertinent negatives include no ear congestion, ear pain, nasal congestion, shortness of breath or wheezing. She has tried rest and cool air (tylenol) for the symptoms. The treatment provided mild relief.    Problems:  Patient Active Problem List   Diagnosis Date Noted   Anxiety and depression 12/11/2022   Elevated TSH 02/24/2020    Allergies: No Known Allergies Medications:  Current Outpatient Medications:    benzonatate (TESSALON) 200 MG capsule, Take 1 capsule (200 mg total) by mouth 3 (three) times daily as needed., Disp: 30 capsule, Rfl: 1   cetirizine (ZYRTEC ALLERGY) 10 MG tablet, Take 1 tablet (10 mg total) by mouth daily., Disp: 90 tablet, Rfl: 1   fluticasone (FLONASE) 50 MCG/ACT nasal spray, Place 2 sprays into both nostrils daily., Disp: 16  g, Rfl: 6   propranolol (INDERAL) 10 MG tablet, Take 10 mg by mouth 2 (two) times daily., Disp: , Rfl:    albuterol (VENTOLIN HFA) 108 (90 Base) MCG/ACT inhaler, INHALE 2 PUFFS INTO THE LUNGS EVERY 6 HOURS AS NEEDED FOR WHEEZE OR SHORTNESS OF BREATH, Disp: 8.5 each, Rfl: 2   desvenlafaxine (PRISTIQ) 50 MG 24 hr tablet, TAKE 1 TABLET BY MOUTH EVERY DAY, Disp: 90 tablet, Rfl: 1   ibuprofen (ADVIL,MOTRIN) 200 MG tablet,  Take 400 mg by mouth as needed., Disp: , Rfl:   Current Facility-Administered Medications:    levonorgestrel (MIRENA) 20 MCG/DAY IUD 1 each, 1 each, Intrauterine, Once, Dettinger, Elige Radon, MD  Observations/Objective: Patient is well-developed, well-nourished in no acute distress.  Resting comfortably  at home.  Head is normocephalic, atraumatic.  No labored breathing.  Speech is clear and coherent with logical content.  Patient is alert and oriented at baseline.  Nasal congestion and dry cough  Assessment and Plan: 1. Viral URI with cough - cetirizine (ZYRTEC ALLERGY) 10 MG tablet; Take 1 tablet (10 mg total) by mouth daily.  Dispense: 90 tablet; Refill: 1 - fluticasone (FLONASE) 50 MCG/ACT nasal spray; Place 2 sprays into both nostrils daily.  Dispense: 16 g; Refill: 6 - benzonatate (TESSALON) 200 MG capsule; Take 1 capsule (200 mg total) by mouth 3 (three) times daily as needed.  Dispense: 30 capsule; Refill: 1  - Take meds as prescribed - Use a cool mist humidifier  -Use saline nose sprays frequently -Force fluids -For any cough or congestion  Use plain Mucinex- regular strength or max strength is fine -For fever or aces or pains- take tylenol or ibuprofen. -Throat lozenges if help -New toothbrush in 3 days   Follow Up Instructions: I discussed the assessment and treatment plan with the patient. The patient was provided an opportunity to ask questions and all were answered. The patient agreed with the plan and demonstrated an understanding of the instructions.  A copy of instructions were sent to the patient via MyChart unless otherwise noted below.     The patient was advised to call back or seek an in-person evaluation if the symptoms worsen or if the condition fails to improve as anticipated.    Joy Rodney, FNP

## 2023-05-07 DIAGNOSIS — R051 Acute cough: Secondary | ICD-10-CM | POA: Diagnosis not present

## 2023-05-07 DIAGNOSIS — J020 Streptococcal pharyngitis: Secondary | ICD-10-CM | POA: Diagnosis not present

## 2023-05-07 DIAGNOSIS — R509 Fever, unspecified: Secondary | ICD-10-CM | POA: Diagnosis not present

## 2023-05-15 DIAGNOSIS — F331 Major depressive disorder, recurrent, moderate: Secondary | ICD-10-CM | POA: Diagnosis not present

## 2023-05-15 DIAGNOSIS — F39 Unspecified mood [affective] disorder: Secondary | ICD-10-CM | POA: Diagnosis not present

## 2023-05-20 ENCOUNTER — Other Ambulatory Visit: Payer: Self-pay | Admitting: Family Medicine

## 2023-05-20 DIAGNOSIS — B9689 Other specified bacterial agents as the cause of diseases classified elsewhere: Secondary | ICD-10-CM

## 2023-05-22 ENCOUNTER — Other Ambulatory Visit: Payer: Self-pay | Admitting: Family Medicine

## 2023-05-22 DIAGNOSIS — B9689 Other specified bacterial agents as the cause of diseases classified elsewhere: Secondary | ICD-10-CM

## 2023-05-22 MED ORDER — ALBUTEROL SULFATE HFA 108 (90 BASE) MCG/ACT IN AERS: 1.0000 | INHALATION_SPRAY | Freq: Four times a day (QID) | RESPIRATORY_TRACT | 2 refills | Status: DC | PRN

## 2023-05-22 NOTE — Telephone Encounter (Signed)
Copied from CRM [id number]. Topic: Clinical - Medication Refill >> May 22, 2023 12:41 PM Adelina Mings wrote: Most Recent Primary Care Visit:  Provider: Jannifer Rodney A  Department: [facility]  Visit Type: MYCHART VIDEO VISIT  Date: 04/30/2023  Medication: albuterol (VENTOLIN HFA) 108 (90 Base) MCG/ACT inhaler    Has the patient contacted their pharmacy? Yes (Agent: If no, request that the patient contact the pharmacy for the refill. If patient does not wish to contact the pharmacy document the reason why and proceed with request.) (Agent: If yes, when and what did the pharmacy advise?)  Is this the correct pharmacy for this prescription? Yes If no, delete pharmacy and type the correct one.  This is the patient's preferred pharmacy:  [id number] - [place], [place] - [address] [place] [address] Phone: [phone] Fax: [phone]   Has the prescription been filled recently? Yes  Is the patient out of the medication? No  Has the patient been seen for an appointment in the last year OR does the patient have an upcoming appointment? Yes  Can we respond through MyChart? No  Agent: Please be advised that Rx refills may take up to 3 business days. We ask that you follow-up with your pharmacy.

## 2023-06-11 ENCOUNTER — Telehealth: Payer: Self-pay | Admitting: Family Medicine

## 2023-06-11 NOTE — Telephone Encounter (Signed)
This pt will need an office visit.

## 2023-06-11 NOTE — Telephone Encounter (Signed)
PATIENT HAS PINK EYE IN HER LEFT EYE AND WANTED TO KNOW IF SHE CAN BE PRESCRIBED SOMETHING FOR HER PINK EYE.

## 2023-06-11 NOTE — Telephone Encounter (Signed)
LMTCB to schedule appt

## 2023-06-12 DIAGNOSIS — H1033 Unspecified acute conjunctivitis, bilateral: Secondary | ICD-10-CM | POA: Diagnosis not present

## 2023-07-16 ENCOUNTER — Encounter: Payer: BC Managed Care – PPO | Admitting: Family Medicine

## 2023-07-27 ENCOUNTER — Other Ambulatory Visit: Payer: Self-pay | Admitting: Family

## 2023-07-27 DIAGNOSIS — B9689 Other specified bacterial agents as the cause of diseases classified elsewhere: Secondary | ICD-10-CM

## 2023-10-25 ENCOUNTER — Other Ambulatory Visit: Payer: Self-pay | Admitting: Family

## 2023-10-25 ENCOUNTER — Other Ambulatory Visit: Payer: Self-pay | Admitting: Family Medicine

## 2023-10-25 DIAGNOSIS — B9689 Other specified bacterial agents as the cause of diseases classified elsewhere: Secondary | ICD-10-CM

## 2023-10-25 DIAGNOSIS — J069 Acute upper respiratory infection, unspecified: Secondary | ICD-10-CM

## 2023-11-25 ENCOUNTER — Other Ambulatory Visit: Payer: Self-pay | Admitting: Family Medicine

## 2023-11-25 DIAGNOSIS — B9689 Other specified bacterial agents as the cause of diseases classified elsewhere: Secondary | ICD-10-CM

## 2023-11-26 NOTE — Telephone Encounter (Signed)
 Called pt to schedule appt, she stated she will call back to schedule appt.

## 2023-11-26 NOTE — Telephone Encounter (Signed)
 Dettinger pt NTBS 30-d given 10/25/23

## 2023-12-08 DIAGNOSIS — J029 Acute pharyngitis, unspecified: Secondary | ICD-10-CM | POA: Diagnosis not present

## 2023-12-14 ENCOUNTER — Ambulatory Visit: Admitting: Family Medicine

## 2023-12-14 ENCOUNTER — Encounter: Payer: Self-pay | Admitting: Family Medicine

## 2023-12-14 VITALS — BP 104/66 | HR 85 | Ht 66.0 in | Wt 152.0 lb

## 2023-12-14 DIAGNOSIS — J4520 Mild intermittent asthma, uncomplicated: Secondary | ICD-10-CM

## 2023-12-14 DIAGNOSIS — Z23 Encounter for immunization: Secondary | ICD-10-CM | POA: Diagnosis not present

## 2023-12-14 DIAGNOSIS — F449 Dissociative and conversion disorder, unspecified: Secondary | ICD-10-CM | POA: Diagnosis not present

## 2023-12-14 MED ORDER — ALBUTEROL SULFATE HFA 108 (90 BASE) MCG/ACT IN AERS: 1.0000 | INHALATION_SPRAY | Freq: Four times a day (QID) | RESPIRATORY_TRACT | 2 refills | Status: DC | PRN

## 2023-12-14 NOTE — Progress Notes (Signed)
 BP 104/66   Pulse 85   Ht 5\' 6"  (1.676 m)   Wt 152 lb (68.9 kg)   SpO2 99%   BMI 24.53 kg/m    Subjective:   Patient ID: Joy Green, female    DOB: 2000/07/24, 23 y.o.   MRN: [id number]  HPI: Joy Green is a 23 y.o. female presenting on 12/14/2023 for Medical Management of Chronic Issues and Wheezing (Refill albuterol )   HPI Asthma Patient is coming in today for refill of medications.  She says during the wintertime she had a little more issues, maybe once a week she had to use her inhaler but now in the summertime she is doing better and using it less frequency but she does need a refill.  She says occasionally she will get tired and have to use it for some wheezing.  Discussed possible need for maintenance inhalers and she feels like it is doing okay and not needed at this point.  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 Interim medical history since our last visit reviewed. Allergies and medications reviewed and updated.  Review of Systems  Constitutional:  Negative for chills and fever.  HENT:  Positive for congestion.   Eyes:  Negative for visual disturbance.  Respiratory:  Positive for wheezing. Negative for chest tightness and shortness of breath.   Cardiovascular:  Negative for chest pain and leg swelling.  Genitourinary:  Negative for difficulty urinating and dysuria.  Musculoskeletal:  Negative for back pain and gait problem.  Skin:  Negative for rash.  Neurological:  Negative for light-headedness and headaches.  Psychiatric/Behavioral:  Negative for agitation and behavioral problems.   All other systems reviewed and are negative.   Per HPI unless specifically indicated above   Allergies as of 12/14/2023   No Known Allergies      Medication List        Accurate as of  December 14, 2023  9:50 AM. If you have any questions, ask your nurse or doctor.          STOP taking these medications    benzonatate  200 MG capsule Commonly known as: TESSALON  Stopped by: Lucio Sabin Bea Duren   cetirizine  10 MG tablet Commonly known as: ZYRTEC  Stopped by: Lucio Sabin Myldred Raju   desvenlafaxine  50 MG 24 hr tablet Commonly known as: PRISTIQ  Stopped by: Lucio Sabin Nansi [place]   fluticasone  50 MCG/ACT nasal spray Commonly known as: FLONASE  Stopped by: Lucio Sabin Kynnedy Carreno       TAKE these medications    albuterol  108 (90 Base) MCG/ACT inhaler Commonly known as: VENTOLIN  HFA Inhale 1-2 puffs into the lungs every 6 (six) hours as needed for wheezing or shortness of breath. What changed: additional instructions Changed by: Lucio Sabin Merrell Borsuk   ARIPiprazole 5 MG tablet Commonly known as: ABILIFY Take 5 mg by mouth daily.   ibuprofen 200 MG tablet Commonly known as: ADVIL Take 200 mg by mouth every 6 (six) hours as needed. What changed: Another medication with the same name was removed. Continue taking this medication, and follow the directions you see here. Changed by: Lucio Sabin Daleyssa Loiselle   propranolol 10 MG tablet Commonly known as: INDERAL Take 10 mg by mouth 2 (two) times daily.         Objective:   BP 104/66   Pulse 85  Ht 5\' 6"  (1.676 m)   Wt 152 lb (68.9 kg)   SpO2 99%   BMI 24.53 kg/m   Wt Readings from Last 3 Encounters:  12/14/23 152 lb (68.9 kg)  01/12/23 139 lb (63 kg)  12/11/22 146 lb (66.2 kg)    Physical Exam Vitals and nursing note reviewed.  Constitutional:      General: She is not in acute distress.    Appearance: She is well-developed. She is not diaphoretic.  Eyes:     Conjunctiva/sclera: Conjunctivae normal.  Cardiovascular:     Rate and Rhythm: Normal rate and regular rhythm.     Heart sounds: Normal heart sounds. No murmur heard. Pulmonary:     Effort: Pulmonary effort is normal. No respiratory distress.     Breath  sounds: Normal breath sounds. No wheezing, rhonchi or rales.  Musculoskeletal:        General: No tenderness. Normal range of motion.  Skin:    General: Skin is warm and dry.     Findings: No rash.  Neurological:     Mental Status: She is alert and oriented to person, place, and time.     Coordination: Coordination normal.  Psychiatric:        Behavior: Behavior normal.       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  Will refill her albuterol  today. Follow up plan: Return if symptoms worsen or fail to improve, for Schedule physical exam and Pap smear as soon as is available.  Counseling provided for all of the vaccine components No orders of the defined types were placed in this encounter.   Jolyne Needs, MD [facility] 12/14/2023, 9:50 AM

## 2023-12-14 NOTE — Addendum Note (Signed)
 Addended by: Francine Iron on: 12/14/2023 10:43 AM   Modules accepted: Orders

## 2023-12-17 DIAGNOSIS — H65193 Other acute nonsuppurative otitis media, bilateral: Secondary | ICD-10-CM | POA: Diagnosis not present

## 2023-12-17 DIAGNOSIS — J039 Acute tonsillitis, unspecified: Secondary | ICD-10-CM | POA: Diagnosis not present

## 2023-12-17 DIAGNOSIS — Z20822 Contact with and (suspected) exposure to covid-19: Secondary | ICD-10-CM | POA: Diagnosis not present

## 2023-12-17 DIAGNOSIS — R070 Pain in throat: Secondary | ICD-10-CM | POA: Diagnosis not present

## 2024-01-23 ENCOUNTER — Other Ambulatory Visit: Payer: Self-pay | Admitting: Family Medicine

## 2024-01-23 DIAGNOSIS — J069 Acute upper respiratory infection, unspecified: Secondary | ICD-10-CM

## 2024-02-04 ENCOUNTER — Ambulatory Visit: Admitting: Family Medicine

## 2024-02-04 ENCOUNTER — Other Ambulatory Visit (HOSPITAL_COMMUNITY)
Admission: RE | Admit: 2024-02-04 | Discharge: 2024-02-04 | Disposition: A | Discharge status: Home / Self Care | Source: Ambulatory Visit | Attending: Family Medicine | Admitting: Family Medicine

## 2024-02-04 VITALS — BP 96/53 | HR 72 | Ht 66.0 in | Wt 160.0 lb

## 2024-02-04 DIAGNOSIS — Z01419 Encounter for gynecological examination (general) (routine) without abnormal findings: Secondary | ICD-10-CM | POA: Insufficient documentation

## 2024-02-04 DIAGNOSIS — F32A Depression, unspecified: Secondary | ICD-10-CM | POA: Diagnosis not present

## 2024-02-04 DIAGNOSIS — Z23 Encounter for immunization: Secondary | ICD-10-CM

## 2024-02-04 DIAGNOSIS — N907 Vulvar cyst: Secondary | ICD-10-CM | POA: Diagnosis not present

## 2024-02-04 DIAGNOSIS — Z01411 Encounter for gynecological examination (general) (routine) with abnormal findings: Secondary | ICD-10-CM | POA: Diagnosis not present

## 2024-02-04 DIAGNOSIS — F419 Anxiety disorder, unspecified: Secondary | ICD-10-CM | POA: Diagnosis not present

## 2024-02-04 MED ORDER — ARIPIPRAZOLE 5 MG PO TABS: 5.0000 mg | ORAL_TABLET | Freq: Every day | ORAL | 0 refills | Status: DC

## 2024-02-04 NOTE — Progress Notes (Signed)
 BP (!) 96/53   Pulse 72   Ht 5' 6 (1.676 m)   Wt 160 lb (72.6 kg)   LMP 01/14/2024 (Approximate)   SpO2 99%   BMI 25.82 kg/m    Subjective:   Patient ID: Joy Green, female    DOB: 2000/12/17, 23 y.o.   MRN: [id number]  HPI: Joy Green is a 23 y.o. female presenting on 02/04/2024 for Medical Management of Chronic Issues (CPE with Pap)   HPI Well woman exam and physical Patient denies any chest pain, shortness of breath, headaches or vision issues, abdominal complaints, diarrhea, nausea, vomiting, or joint issues.  Patient's only biggest complaint is she has a small bump on her right labia that she wants us  to take a look at.  This has been there for a couple weeks and it is tender when she touches it but not when she does not.  She has not had any drainage or discharge.  Anxiety depression recheck Patient is coming today for anxiety and depression recheck.  She is currently taking the Abilify .  Says she is not seeing the psychiatrist anymore due to cost but she feels like things are going well and wants us  to continue to prescribe.  She denies any suicidal ideations or thoughts of hurting herself.  She feels like the Abilify  does help her    02/04/2024   10:05 AM 12/14/2023    9:37 AM 01/12/2023    9:25 AM 12/11/2022   10:20 AM 11/13/2022    8:37 AM  Depression screen PHQ 2/9  Decreased Interest 0 1 0 1 0  Down, Depressed, Hopeless 1 1 0 1 3  PHQ - 2 Score 1 2 0 2 3  Altered sleeping 1 0 1 1 0  Tired, decreased energy 1 2 2  0 0  Change in appetite 1 2 3 3 1   Feeling bad or failure about yourself  1 1 0 2 3  Trouble concentrating 1 3 2 1 2   Moving slowly or fidgety/restless 0 0 0 0 2  Suicidal thoughts 1 1 0 2 1  PHQ-9 Score 7 11 8 11 12   Difficult doing work/chores Somewhat difficult Somewhat difficult Somewhat difficult Somewhat difficult Somewhat difficult     Relevant past medical, surgical, family and social history reviewed and updated as indicated. Interim medical  history since our last visit reviewed. Allergies and medications reviewed and updated.  Review of Systems  Constitutional:  Negative for chills and fever.  HENT:  Negative for ear pain and tinnitus.   Eyes:  Negative for pain.  Respiratory:  Negative for cough, shortness of breath and wheezing.   Cardiovascular:  Negative for chest pain, palpitations and leg swelling.  Gastrointestinal:  Negative for abdominal pain, blood in stool, constipation and diarrhea.  Genitourinary:  Negative for dysuria and hematuria.  Musculoskeletal:  Negative for back pain and myalgias.  Skin:  Negative for rash.  Neurological:  Negative for dizziness, weakness and headaches.  Psychiatric/Behavioral:  Positive for dysphoric mood. Negative for suicidal ideas. The patient is nervous/anxious.     Per HPI unless specifically indicated above   Allergies as of 02/04/2024   No Known Allergies      Medication List        Accurate as of February 04, 2024 11:12 AM. If you have any questions, ask your nurse or doctor.          albuterol  108 (90 Base) MCG/ACT inhaler Commonly known as: VENTOLIN  HFA Inhale 1-2  puffs into the lungs every 6 (six) hours as needed for wheezing or shortness of breath.   ARIPiprazole  5 MG tablet Commonly known as: ABILIFY  Take 1 tablet (5 mg total) by mouth daily.   ibuprofen 200 MG tablet Commonly known as: ADVIL Take 200 mg by mouth every 6 (six) hours as needed.   levonorgestrel  20 MCG/DAY Iud Commonly known as: MIRENA  1 each by Intrauterine route once. Placed in 2022   propranolol 10 MG tablet Commonly known as: INDERAL Take 10 mg by mouth 2 (two) times daily.         Objective:   BP (!) 96/53   Pulse 72   Ht 5' 6 (1.676 m)   Wt 160 lb (72.6 kg)   LMP 01/14/2024 (Approximate)   SpO2 99%   BMI 25.82 kg/m   Wt Readings from Last 3 Encounters:  02/04/24 160 lb (72.6 kg)  12/14/23 152 lb (68.9 kg)  01/12/23 139 lb (63 kg)    Physical Exam Vitals  reviewed. Exam conducted with a chaperone present.  Constitutional:      General: She is not in acute distress.    Appearance: She is well-developed. She is not diaphoretic.  Eyes:     Conjunctiva/sclera: Conjunctivae normal.     Pupils: Pupils are equal, round, and reactive to light.  Neck:     Thyroid : No thyromegaly.  Cardiovascular:     Rate and Rhythm: Normal rate and regular rhythm.     Heart sounds: Normal heart sounds. No murmur heard. Pulmonary:     Effort: Pulmonary effort is normal. No respiratory distress.     Breath sounds: Normal breath sounds. No wheezing.  Chest:  Breasts:    Breasts are symmetrical.     Right: No inverted nipple, mass, nipple discharge, skin change or tenderness.     Left: No inverted nipple, mass, nipple discharge, skin change or tenderness.  Abdominal:     General: Bowel sounds are normal. There is no distension.     Palpations: Abdomen is soft.     Tenderness: There is no abdominal tenderness. There is no guarding or rebound.  Genitourinary:    Exam position: Lithotomy position.     Labia:        Right: No rash or lesion.        Left: No rash or lesion.      Vagina: Normal.     Cervix: No cervical motion tenderness, discharge or friability.     Uterus: Not deviated, not enlarged, not fixed and not tender.      Adnexa:        Right: No mass or tenderness.         Left: No mass or tenderness.     Musculoskeletal:        General: Normal range of motion.     Cervical back: Neck supple.  Lymphadenopathy:     Cervical: No cervical adenopathy.  Skin:    General: Skin is warm and dry.     Findings: No rash.  Neurological:     Mental Status: She is alert and oriented to person, place, and time.     Coordination: Coordination normal.  Psychiatric:        Behavior: Behavior normal.       Assessment & Plan:   Problem List Items Addressed This Visit       Other   Anxiety and depression   Other Visit Diagnoses       Well woman  exam    -  Primary   Relevant Orders   Cytology - PAP([place])     Epidermal cyst of vulva         Need for meningitis vaccination       Relevant Orders   Meningococcal B, OMV (Bexsero) (Completed)       Will do Pap, patient says she is doing well and declines STD testing.  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  She has not been wearing it for 3 years and is satisfied with it.  She is looking to possibly get her tubes tied instructed her to call gynecology but I do not know if they would do it at her age or not. Follow up plan: Return if symptoms worsen or fail to improve, for 3 to 20-month anxiety and depression recheck.  Counseling provided for all of the vaccine components Orders Placed This Encounter  Procedures   Meningococcal B, OMV (Bexsero)    Fonda Levins, MD [facility] 02/04/2024, 11:12 AM

## 2024-02-12 LAB — CYTOLOGY - PAP
Chlamydia: NEGATIVE
Comment: NEGATIVE
Comment: NORMAL
Diagnosis: NEGATIVE
Neisseria Gonorrhea: NEGATIVE

## 2024-02-13 ENCOUNTER — Ambulatory Visit: Payer: Self-pay | Admitting: Family Medicine

## 2024-02-19 DIAGNOSIS — Z3009 Encounter for other general counseling and advice on contraception: Secondary | ICD-10-CM | POA: Diagnosis not present

## 2024-02-19 DIAGNOSIS — Z302 Encounter for sterilization: Secondary | ICD-10-CM | POA: Diagnosis not present

## 2024-03-07 ENCOUNTER — Other Ambulatory Visit: Payer: Self-pay | Admitting: Family Medicine

## 2024-03-07 DIAGNOSIS — J4520 Mild intermittent asthma, uncomplicated: Secondary | ICD-10-CM

## 2024-05-03 ENCOUNTER — Other Ambulatory Visit: Payer: Self-pay | Admitting: Family Medicine

## 2024-05-12 ENCOUNTER — Ambulatory Visit: Payer: Self-pay | Admitting: Family Medicine

## 2024-05-12 ENCOUNTER — Encounter: Payer: Self-pay | Admitting: Family Medicine

## 2024-05-12 VITALS — BP 99/59 | HR 69 | Temp 97.5°F | Ht 66.0 in | Wt 179.0 lb

## 2024-05-12 DIAGNOSIS — Z23 Encounter for immunization: Secondary | ICD-10-CM

## 2024-05-12 DIAGNOSIS — F419 Anxiety disorder, unspecified: Secondary | ICD-10-CM

## 2024-05-12 DIAGNOSIS — F32A Depression, unspecified: Secondary | ICD-10-CM

## 2024-05-12 DIAGNOSIS — F449 Dissociative and conversion disorder, unspecified: Secondary | ICD-10-CM | POA: Diagnosis not present

## 2024-05-12 MED ORDER — ARIPIPRAZOLE 10 MG PO TABS: 10.0000 mg | ORAL_TABLET | Freq: Every day | ORAL | 1 refills | Status: AC

## 2024-05-12 NOTE — Progress Notes (Signed)
 BP (!) 99/59   Pulse 69   Temp (!) 97.5 F (36.4 C)   Ht 5' 6 (1.676 m)   Wt 179 lb (81.2 kg)   SpO2 98%   BMI 28.89 kg/m    Subjective:   Patient ID: Joy Green, female    DOB: Mar 09, 2001, 23 y.o.   MRN: [id number]  HPI: Joy Green is a 23 y.o. female presenting on 05/12/2024 for Medical Management of Chronic Issues, Anxiety, and Depression   Discussed the use of AI scribe software for clinical note transcription with the patient, who gave verbal consent to proceed.  History of Present Illness   Joy Green is a 23 year old female who presents for a recheck of stress and anxiety management.  Psychological stress and mood disturbance - Increased stress primarily related to financial issues - Mood fluctuates with more good days than bad, though the balance is approaching even - No thoughts of self-harm or suicide - Recent increase in alcohol consumption over Halloween weekend, contributing to feeling down  Anxiety symptoms - Anxiety has improved over the past couple of months - Propranolol use has decreased in frequency - Severe anxiety occurred a few months ago but has since stabilized - Occasional headaches associated with anxiety, described as mild  Medication effects and adherence - Currently taking Abilify , with variable efficacy depending on the day - Current dose of Abilify  for approximately one year - Significant tiredness occurs if a dose is missed for a day or two and then restarted  Menstrual symptoms - Mirena  IUD in place - Menstrual cycle is regular but slightly heavier than prior to IUD placement, not excessive - Experiences bad cramps at times, but they are manageable  Autonomic symptoms - Shakiness present - Consistently low blood pressure, not causing significant concern           05/12/2024   10:34 AM 02/04/2024   10:05 AM 12/14/2023    9:37 AM 01/12/2023    9:25 AM 12/11/2022   10:20 AM  Depression screen PHQ 2/9  Decreased Interest 1 0 1  0 1  Down, Depressed, Hopeless 1 1 1  0 1  PHQ - 2 Score 2 1 2  0 2  Altered sleeping 1 1 0 1 1  Tired, decreased energy 1 1 2 2  0  Change in appetite 1 1 2 3 3   Feeling bad or failure about yourself  1 1 1  0 2  Trouble concentrating 2 1 3 2 1   Moving slowly or fidgety/restless 0 0 0 0 0  Suicidal thoughts 1 1 1  0 2  PHQ-9 Score 9 7 11 8 11   Difficult doing work/chores Somewhat difficult Somewhat difficult Somewhat difficult Somewhat difficult Somewhat difficult      Relevant past medical, surgical, family and social history reviewed and updated as indicated. Interim medical history since our last visit reviewed. Allergies and medications reviewed and updated.  Review of Systems  Constitutional:  Negative for chills and fever.  HENT:  Negative for congestion, ear discharge and ear pain.   Eyes:  Negative for redness and visual disturbance.  Respiratory:  Negative for chest tightness and shortness of breath.   Cardiovascular:  Negative for chest pain and leg swelling.  Genitourinary:  Negative for difficulty urinating and dysuria.  Musculoskeletal:  Negative for back pain and gait problem.  Skin:  Negative for rash.  Neurological:  Negative for dizziness, light-headedness and headaches.  Psychiatric/Behavioral:  Positive for dysphoric mood. Negative for agitation  and behavioral problems. The patient is nervous/anxious.   All other systems reviewed and are negative.   Per HPI unless specifically indicated above   Allergies as of 05/12/2024   No Known Allergies      Medication List        Accurate as of May 12, 2024 11:06 AM. If you have any questions, ask your nurse or doctor.          albuterol  108 (90 Base) MCG/ACT inhaler Commonly known as: VENTOLIN  HFA INHALE 1-2 PUFFS BY MOUTH EVERY 6 HOURS AS NEEDED FOR WHEEZE OR SHORTNESS OF BREATH   ARIPiprazole  10 MG tablet Commonly known as: ABILIFY  Take 1 tablet (10 mg total) by mouth daily. What changed:   medication strength how much to take Changed by: Fonda LABOR Callaway Hardigree   ibuprofen 200 MG tablet Commonly known as: ADVIL Take 200 mg by mouth every 6 (six) hours as needed.   levonorgestrel  20 MCG/DAY Iud Commonly known as: MIRENA  1 each by Intrauterine route once. Placed in 2022   propranolol 10 MG tablet Commonly known as: INDERAL Take 10 mg by mouth 2 (two) times daily.         Objective:   BP (!) 99/59   Pulse 69   Temp (!) 97.5 F (36.4 C)   Ht 5' 6 (1.676 m)   Wt 179 lb (81.2 kg)   SpO2 98%   BMI 28.89 kg/m   Wt Readings from Last 3 Encounters:  05/12/24 179 lb (81.2 kg)  02/04/24 160 lb (72.6 kg)  12/14/23 152 lb (68.9 kg)    Physical Exam Vitals and nursing note reviewed.  Constitutional:      General: She is not in acute distress.    Appearance: She is well-developed. She is not diaphoretic.  Eyes:     Conjunctiva/sclera: Conjunctivae normal.  Cardiovascular:     Rate and Rhythm: Normal rate and regular rhythm.     Heart sounds: Normal heart sounds. No murmur heard. Pulmonary:     Effort: Pulmonary effort is normal. No respiratory distress.     Breath sounds: Normal breath sounds. No wheezing.  Skin:    General: Skin is warm and dry.     Findings: No rash.  Neurological:     Mental Status: She is alert and oriented to person, place, and time.     Coordination: Coordination normal.  Psychiatric:        Behavior: Behavior normal.    Physical Exam   NECK: Thyroid  normal, no nodules. CHEST: Lungs clear to auscultation bilaterally. CARDIOVASCULAR: Heart regular rate and rhythm, no murmurs. BREAST: Breasts symmetrical, no swelling. EXTREMITIES: Pulses intact bilaterally, no edema.         Assessment & Plan:   Problem List Items Addressed This Visit       Other   Anxiety and depression   Dissociation   Other Visit Diagnoses       Encounter for immunization    -  Primary   Relevant Orders   Flu vaccine trivalent PF, 6mos and  older(Flulaval,Afluria,Fluarix,Fluzone) (Completed)           Anxiety and Depression Abilify  may be losing effectiveness. Anxiety improved with less propranolol use. No self-harm or suicidal thoughts. - Increase Abilify  to 10 mg daily. - Instruct [facility] to update prescription dosage. - Schedule follow-up in two months to evaluate medication response.  Low Blood Pressure and Shakiness Shakiness due to low blood pressure and low blood sugar, exacerbated by increased alcohol intake. -  Encourage hydration with non-caffeinated fluids. - Advise reducing soda and increasing water intake. - Monitor blood pressure regularly.          Follow up plan: Return if symptoms worsen or fail to improve, for 30-month recheck anxiety and depression.  Counseling provided for all of the vaccine components Orders Placed This Encounter  Procedures   Flu vaccine trivalent PF, 6mos and older(Flulaval,Afluria,Fluarix,Fluzone)    Fonda Levins, MD [facility] 05/12/2024, 11:06 AM

## 2024-06-19 ENCOUNTER — Telehealth: Payer: Self-pay

## 2024-06-19 NOTE — Telephone Encounter (Signed)
 Dr. Maryanne has completed [facility] Form.  Pt needs to have a PPD skin test of a Quant test 67m within starting classed.  Pts last PPD was 10/2021.  Left message making pt aware and to call back to let us  know which test she would like to do and then schedule either with the lab or nurse.  Pt would like paperwork emailed to [email or web address] 

## 2024-06-24 ENCOUNTER — Ambulatory Visit

## 2024-06-24 DIAGNOSIS — Z111 Encounter for screening for respiratory tuberculosis: Secondary | ICD-10-CM

## 2024-06-24 NOTE — Progress Notes (Signed)
 Patient is in office today for a nurse visit for TB skin test placement. Patient Injection was given in the  Left arm. Patient tolerated injection well.

## 2024-06-26 ENCOUNTER — Ambulatory Visit (INDEPENDENT_AMBULATORY_CARE_PROVIDER_SITE_OTHER): Admitting: *Deleted

## 2024-06-26 DIAGNOSIS — Z111 Encounter for screening for respiratory tuberculosis: Secondary | ICD-10-CM

## 2024-06-26 LAB — TB SKIN TEST
Induration: 0 mm
TB Skin Test: NEGATIVE

## 2024-06-26 NOTE — Progress Notes (Signed)
 Patient came into clinic for 48 hour skin test read. Negative for TB.

## 2024-07-07 NOTE — Telephone Encounter (Signed)
 Pt came in and had TB skin test. Which was negative. Documentation of results and vaccine hx emailed to [email or web address]  at pts request. Pt made aware to call back if she does not receive.

## 2024-07-17 ENCOUNTER — Encounter: Payer: Self-pay | Admitting: Family Medicine

## 2024-07-17 ENCOUNTER — Ambulatory Visit: Admitting: Family Medicine

## 2024-07-17 VITALS — BP 119/75 | HR 80 | Ht 66.0 in | Wt 194.0 lb

## 2024-07-17 DIAGNOSIS — R350 Frequency of micturition: Secondary | ICD-10-CM

## 2024-07-17 DIAGNOSIS — F419 Anxiety disorder, unspecified: Secondary | ICD-10-CM

## 2024-07-17 DIAGNOSIS — F32A Depression, unspecified: Secondary | ICD-10-CM

## 2024-07-17 LAB — MICROSCOPIC EXAMINATION
RBC, Urine: NONE SEEN /HPF (ref 0–2)
Renal Epithel, UA: NONE SEEN /HPF
Yeast, UA: NONE SEEN

## 2024-07-17 LAB — URINALYSIS, COMPLETE
Bilirubin, UA: NEGATIVE
Glucose, UA: NEGATIVE
Ketones, UA: NEGATIVE
Nitrite, UA: NEGATIVE
Protein,UA: NEGATIVE
Specific Gravity, UA: 1.02 (ref 1.005–1.030)
Urobilinogen, Ur: 0.2 mg/dL (ref 0.2–1.0)
pH, UA: 7 (ref 5.0–7.5)

## 2024-07-17 MED ORDER — ATOMOXETINE HCL 25 MG PO CAPS: 25.0000 mg | ORAL_CAPSULE | Freq: Every day | ORAL | 1 refills | Status: AC

## 2024-07-17 NOTE — Progress Notes (Signed)
 "  BP 119/75   Pulse 80   Ht 5' 6 (1.676 m)   Wt 194 lb (88 kg)   SpO2 98%   BMI 31.31 kg/m    Subjective:   Patient ID: Joy Green, female    DOB: 01/17/2001, 24 y.o.   MRN: [id number]  HPI: Joy Green is a 24 y.o. female presenting on 07/17/2024 for Medical Management of Chronic Issues and increased urination   Discussed the use of AI scribe software for clinical note transcription with the patient, who gave verbal consent to proceed.  History of Present Illness   Joy Green is a 24 year old female who presents for a recheck and evaluation of urinary issues.  Urinary urgency - Occasional episodes of sudden need to urinate with difficulty holding urine - Symptom onset after first urinary tract infection a few months ago - Completed a course of antibiotics for UTI; unclear if urgency began after finishing medication - No dysuria, hematuria, vaginal discharge, irritation, or bladder pain - Consumes both water and caffeinated beverages; actively reducing caffeine intake  Psychiatric symptoms and medication - Currently taking Abilify  for anxiety and depression - Finds Abilify  effective with no significant side effects - Occasionally forgets a dose, resulting in next-day tiredness; takes medication at night to help with this - No suicidal ideation or self-harm thoughts - No current counselor due to cost  Cognitive and executive function concerns - Difficulty with task prioritization and time management, nearly late to work on several occasions - Symptoms more noticeable in recent months with increased responsibilities - Fianc with ADHD suggested possible similar symptoms - Some history of difficulties in school, but unable to recall specific details - Previously recommended for ADHD testing by psychiatrist  Gynecological and contraceptive history - Currently using Mirena  IUD - Experiences regular, lighter menstrual cycles with increased cramping compared to prior  cycles - Considering tubal ligation; in contact with a physician in [place] regarding the procedure        07/17/2024    1:50 PM 07/17/2024    1:49 PM 05/12/2024   10:34 AM 02/04/2024   10:05 AM 12/14/2023    9:37 AM  Depression screen PHQ 2/9  Decreased Interest  1 1 0 1  Down, Depressed, Hopeless  0 1 1 1   PHQ - 2 Score  1 2 1 2   Altered sleeping 2  1 1  0  Tired, decreased energy 2  1 1 2   Change in appetite 3  1 1 2   Feeling bad or failure about yourself  1  1 1 1   Trouble concentrating 3  2 1 3   Moving slowly or fidgety/restless 0  0 0 0  Suicidal thoughts 0  1 1 1   PHQ-9 Score   9  7  11    Difficult doing work/chores Somewhat difficult  Somewhat difficult Somewhat difficult Somewhat difficult     Data saved with a previous flowsheet row definition        Relevant past medical, surgical, family and social history reviewed and updated as indicated. Interim medical history since our last visit reviewed. Allergies and medications reviewed and updated.  Review of Systems  Constitutional:  Negative for chills and fever.  Eyes:  Negative for redness and visual disturbance.  Respiratory:  Negative for chest tightness and shortness of breath.   Cardiovascular:  Negative for chest pain and leg swelling.  Gastrointestinal:  Negative for abdominal pain.  Genitourinary:  Positive for dysuria, frequency and urgency. Negative  for decreased urine volume, difficulty urinating, flank pain, vaginal bleeding, vaginal discharge and vaginal pain.  Musculoskeletal:  Negative for back pain and gait problem.  Skin:  Negative for rash.  Neurological:  Negative for light-headedness and headaches.  Psychiatric/Behavioral:  Negative for agitation and behavioral problems.   All other systems reviewed and are negative.   Per HPI unless specifically indicated above   Allergies as of 07/17/2024   No Known Allergies      Medication List        Accurate as of July 17, 2024  2:37 PM. If you  have any questions, ask your nurse or doctor.          albuterol  108 (90 Base) MCG/ACT inhaler Commonly known as: VENTOLIN  HFA INHALE 1-2 PUFFS BY MOUTH EVERY 6 HOURS AS NEEDED FOR WHEEZE OR SHORTNESS OF BREATH   ARIPiprazole  10 MG tablet Commonly known as: ABILIFY  Take 1 tablet (10 mg total) by mouth daily.   atomoxetine  25 MG capsule Commonly known as: Strattera  Take 1 capsule (25 mg total) by mouth daily. Started by: Fonda Levins, MD   ibuprofen 200 MG tablet Commonly known as: ADVIL Take 200 mg by mouth every 6 (six) hours as needed.   levonorgestrel  20 MCG/DAY Iud Commonly known as: MIRENA  1 each by Intrauterine route once. Placed in 2022   propranolol 10 MG tablet Commonly known as: INDERAL Take 10 mg by mouth 2 (two) times daily.         Objective:   BP 119/75   Pulse 80   Ht 5' 6 (1.676 m)   Wt 194 lb (88 kg)   SpO2 98%   BMI 31.31 kg/m   Wt Readings from Last 3 Encounters:  07/17/24 194 lb (88 kg)  05/12/24 179 lb (81.2 kg)  02/04/24 160 lb (72.6 kg)    Physical Exam Vitals and nursing note reviewed.  Constitutional:      Appearance: Normal appearance.  Abdominal:     General: Abdomen is flat. Bowel sounds are normal. There is no distension.     Palpations: Abdomen is soft.     Tenderness: There is no abdominal tenderness. There is no right CVA tenderness, left CVA tenderness, guarding or rebound.     Hernia: No hernia is present.  Neurological:     Mental Status: She is alert.    Physical Exam   CHEST: Lungs clear to auscultation bilaterally. CARDIOVASCULAR: Heart sounds normal. Left lower back: Mild tenderness on the left side, likely muscular.         Assessment & Plan:   Problem List Items Addressed This Visit       Other   Anxiety and depression - Primary   Relevant Medications   atomoxetine  (STRATTERA ) 25 MG capsule   Other Visit Diagnoses       Urinary frequency       Relevant Orders   Urinalysis, Complete    Urine Culture          Anxiety and depression Well-managed with Abilify . No side effects reported. - Continue Abilify  as prescribed.  Urinary urgency Intermittent urgency post-UTI treatment. Possible causes include caffeine intake, residual low-grade infection, or yeast irritation. - Reduce caffeine intake. - Encourage increased water consumption. - Obtained urine sample for analysis.  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 Discussed potential for non-stimulant ADHD medications and need for formal assessment by psychiatry. - Consider trial of non-stimulant ADHD medication such as Intuniv or Strattera , pending insurance coverage. -  Explore options for ADHD testing through psychiatry. - Monitor for mood swings as a potential side effect of ADHD medication.          Follow up plan: Return in about 4 weeks (around 08/14/2024), or if symptoms worsen or fail to improve, for ADHD anxiety depression recheck.  Counseling provided for all of the vaccine components Orders Placed This Encounter  Procedures   Urine Culture   Urinalysis, Complete    Fonda Levins, MD [facility] 07/17/2024, 2:37 PM     "

## 2024-07-19 LAB — URINE CULTURE

## 2024-07-21 ENCOUNTER — Ambulatory Visit: Payer: Self-pay | Admitting: Family Medicine

## 2024-08-01 ENCOUNTER — Telehealth: Payer: Self-pay | Admitting: Family Medicine

## 2024-08-01 NOTE — Telephone Encounter (Signed)
 Okay thanks for letting me know, yes I agree with stopping the medicine and when she returns we can discuss other options if she would like to try something different for ADHD

## 2024-08-01 NOTE — Telephone Encounter (Signed)
 Copied from CRM #[id number]. Topic: Clinical - Prescription Issue >> Aug 01, 2024  1:14 PM Alfonso ORN wrote: Reason for CRM: patient want to let Dettinger,Joshua know the medication that she taking to treat ADHD causing  was extremely emotional and depression , patient stop taking the medication after stop taking the medication pt feels better

## 2024-08-01 NOTE — Telephone Encounter (Signed)
 Left message making pt aware of Dr. Maryanne recommendations and to call back if needed.

## 2024-08-07 ENCOUNTER — Ambulatory Visit: Admitting: Family Medicine

## 2024-08-14 ENCOUNTER — Ambulatory Visit: Admitting: Family Medicine

## 2024-09-29 ENCOUNTER — Ambulatory Visit: Admitting: Family Medicine

## 2024-10-13 ENCOUNTER — Ambulatory Visit (HOSPITAL_COMMUNITY): Admit: 2024-10-13 | Admitting: Obstetrics and Gynecology

## 2024-10-13 SURGERY — SALPINGECTOMY, BILATERAL, LAPAROSCOPIC
Anesthesia: Choice | Laterality: Bilateral
